# Patient Record
Sex: Female | Born: 1965 | Race: Black or African American | Hispanic: No | Marital: Married | Smoking: Never smoker
Health system: Southern US, Community
[De-identification: ages and names within clinical notes are randomized; demographics above are authoritative.]

## PROBLEM LIST (undated history)

## (undated) DIAGNOSIS — F329 Major depressive disorder, single episode, unspecified: Secondary | ICD-10-CM

## (undated) DIAGNOSIS — F32A Depression, unspecified: Secondary | ICD-10-CM

## (undated) DIAGNOSIS — G43909 Migraine, unspecified, not intractable, without status migrainosus: Secondary | ICD-10-CM

## (undated) DIAGNOSIS — L509 Urticaria, unspecified: Secondary | ICD-10-CM

## (undated) DIAGNOSIS — K219 Gastro-esophageal reflux disease without esophagitis: Secondary | ICD-10-CM

## (undated) HISTORY — PX: KNEE ARTHROSCOPY: SHX127

---

## 2011-06-15 ENCOUNTER — Ambulatory Visit: Payer: Self-pay

## 2012-01-05 ENCOUNTER — Ambulatory Visit: Payer: Self-pay | Admitting: Internal Medicine

## 2012-01-05 ENCOUNTER — Emergency Department: Payer: Self-pay | Admitting: Emergency Medicine

## 2012-01-05 LAB — COMPREHENSIVE METABOLIC PANEL
Albumin: 3.9 g/dL (ref 3.4–5.0)
Alkaline Phosphatase: 80 U/L (ref 50–136)
BUN: 13 mg/dL (ref 7–18)
Co2: 26 mmol/L (ref 21–32)
Creatinine: 0.86 mg/dL (ref 0.60–1.30)
EGFR (African American): >60
Glucose: 68 mg/dL (ref 65–99)
SGPT (ALT): 15 U/L (ref 12–78)
Sodium: 137 mmol/L (ref 136–145)
Total Protein: 8.1 g/dL (ref 6.4–8.2)

## 2012-01-05 LAB — CBC
HCT: 38.7 % (ref 35.0–47.0)
MCV: 77 fL — ABNORMAL LOW (ref 80–100)
Platelet: 416 10*3/uL (ref 150–440)
RBC: 5.04 10*6/uL (ref 3.80–5.20)
WBC: 9.7 10*3/uL (ref 3.6–11.0)

## 2012-01-05 LAB — TROPONIN I
Troponin-I: <0.02 ng/mL
Troponin-I: <0.02 ng/mL

## 2012-01-05 LAB — CK TOTAL AND CKMB (NOT AT ARMC): CK-MB: 4.3 ng/mL — ABNORMAL HIGH (ref 0.5–3.6)

## 2017-01-29 ENCOUNTER — Ambulatory Visit
Admission: EM | Admit: 2017-01-29 | Discharge: 2017-01-29 | Disposition: A | Discharge status: Home / Self Care | Payer: BC Managed Care – PPO | Attending: Family Medicine | Admitting: Family Medicine

## 2017-01-29 DIAGNOSIS — T887XXA Unspecified adverse effect of drug or medicament, initial encounter: Secondary | ICD-10-CM

## 2017-01-29 DIAGNOSIS — J029 Acute pharyngitis, unspecified: Secondary | ICD-10-CM | POA: Diagnosis not present

## 2017-01-29 DIAGNOSIS — T50905A Adverse effect of unspecified drugs, medicaments and biological substances, initial encounter: Secondary | ICD-10-CM

## 2017-01-29 HISTORY — DX: Gastro-esophageal reflux disease without esophagitis: K21.9

## 2017-01-29 HISTORY — DX: Depression, unspecified: F32.A

## 2017-01-29 HISTORY — DX: Major depressive disorder, single episode, unspecified: F32.9

## 2017-01-29 HISTORY — DX: Urticaria, unspecified: L50.9

## 2017-01-29 HISTORY — DX: Migraine, unspecified, not intractable, without status migrainosus: G43.909

## 2017-01-29 LAB — RAPID STREP SCREEN (MED CTR MEBANE ONLY): STREPTOCOCCUS, GROUP A SCREEN (DIRECT): NEGATIVE

## 2017-01-29 NOTE — Discharge Instructions (Signed)
Continue using Allegra, Zantac 150 mg twice daily, and Benadryl. Recommend following up with your primary care physician at [facility] , [place]. Go to The emergency room if symptoms worsen.

## 2017-01-29 NOTE — ED Notes (Signed)
AVS reviewed with pt and family. Verbalized understanding. Pt has no questions or concerns at this time. Pt ambulatory at time of discharge.

## 2017-01-29 NOTE — ED Triage Notes (Signed)
Pt reports she had a "crusty" right eye several days ago but has improved. Now 2 days of mouth soreness, tongue burning, sore throat and developing laryngitis. No cough or fever. Had knee surgery last week and went home with Oxycodone and Phenergan which she has stopped.

## 2017-01-29 NOTE — ED Provider Notes (Signed)
[facility]    CSN: [id number] Arrival date & time: 01/29/17  1232     History   Chief Complaint Chief Complaint  Patient presents with  . Sore Throat    HPI Sarah Warner is a 51 y.o. female.   HPI  Subjective 51 year old female who presents with mouth soreness tongue burning sore throat and hoarseness. Said no cough or fever. Patient states that a week ago she underwent a an arthroscopy for meniscectomy discharged home on oxycodone and Phenergan. She states that she stop the Phenergan oxycodone about 2 days ago after she developed the mouth soreness tongue burning and sore throat. She has a history of chronic I angioedema and urticaria. She routinely takes Allegra but had stopped the Allegra daily before her symptoms with the oxycodone and Phenergan. Since restarted it. She denies any fever or chills. She's had no shortness of breath or difficulty breathing. She has had no appearance of any hives on the skin.        Past Medical History:  Diagnosis Date  . Depression   . GERD (gastroesophageal reflux disease)   . Hives   . Migraines     There are no active problems to display for this patient.   Past Surgical History:  Procedure Laterality Date  . CESAREAN SECTION    . KNEE ARTHROSCOPY      OB History    No data available       Home Medications    Prior to Admission medications   Medication Sig Start Date End Date Taking? Authorizing Provider  fexofenadine (ALLEGRA) 180 MG tablet Take 180 mg by mouth daily.   Yes [provider]  hydroxychloroquine (PLAQUENIL) 200 MG tablet Take by mouth daily.   Yes [provider]  hydrOXYzine (VISTARIL) 25 MG capsule Take 25 mg by mouth 3 (three) times daily as needed.   Yes [provider]  mirtazapine (REMERON) 15 MG tablet Take 15 mg by mouth at bedtime.   Yes [provider]  OnabotulinumtoxinA (BOTOX IJ) Inject as directed.   Yes [provider]  pantoprazole  (PROTONIX) 20 MG tablet Take 20 mg by mouth daily.   Yes [provider]  ranitidine (ZANTAC) 150 MG tablet Take 150 mg by mouth 2 (two) times daily.   Yes [provider]  topiramate (TOPAMAX) 100 MG tablet Take 100 mg by mouth daily.   Yes [provider]  venlafaxine (EFFEXOR) 75 MG tablet Take 75 mg by mouth 3 (three) times daily with meals.   Yes [provider]    Family History History reviewed. No pertinent family history.  Social History Social History  Substance Use Topics  . Smoking status: Never Smoker  . Smokeless tobacco: Never Used  . Alcohol use Yes     Comment: social     Allergies   Nsaids and Erythromycin   Review of Systems Review of Systems  Constitutional: Positive for activity change. Negative for appetite change, chills, fatigue and fever.  HENT: Positive for sore throat and voice change.   Respiratory: Negative for cough, shortness of breath, wheezing and stridor.   All other systems reviewed and are negative.    Physical Exam Triage Vital Signs ED Triage Vitals [01/29/17 1305]  Enc Vitals Group     BP 128/65     Pulse Rate 94     Resp 20     Temp 98.7 F (37.1 C)     Temp Source Oral  SpO2 100 %     Weight 240 lb (108.9 kg)     Height 5\' 5"  (1.651 m)     Head Circumference      Peak Flow      Pain Score 2     Pain Loc      Pain Edu?      Excl. in GC?    No data found.   Updated Vital Signs BP 128/65 (BP Location: Left Arm)   Pulse 94   Temp 98.7 F (37.1 C) (Oral)   Resp 20   Ht 5\' 5"  (1.651 m)   Wt 240 lb (108.9 kg)   LMP 01/13/2017   SpO2 100%   BMI 39.94 kg/m   Visual Acuity Right Eye Distance:   Left Eye Distance:   Bilateral Distance:    Right Eye Near:   Left Eye Near:    Bilateral Near:     Physical Exam  Constitutional: She is oriented to person, place, and time. She appears well-developed and well-nourished. No distress.  HENT:  Head: Normocephalic.  Right Ear:  External ear normal.  Left Ear: External ear normal.  Nose: Nose normal.  Mouth/Throat: Oropharynx is clear and moist. No oropharyngeal exudate.  Patient does have a slight hoarseness  Eyes: Pupils are equal, round, and reactive to light. Conjunctivae are normal. Right eye exhibits no discharge. Left eye exhibits no discharge.  Neck: Normal range of motion. Neck supple.  Pulmonary/Chest: Effort normal and breath sounds normal.  Musculoskeletal: Normal range of motion.  Lymphadenopathy:    She has no cervical adenopathy.  Neurological: She is alert and oriented to person, place, and time.  Skin: Skin is warm and dry. No rash noted. She is not diaphoretic.  Psychiatric: She has a normal mood and affect. Her behavior is normal. Judgment and thought content normal.  Nursing note and vitals reviewed.    UC Treatments / Results  Labs (all labs ordered are listed, but only abnormal results are displayed) Labs Reviewed  RAPID STREP SCREEN (NOT AT [facility])  CULTURE, GROUP A STREP [facility])    EKG  EKG Interpretation None       Radiology No results found.  Procedures Procedures (including critical care time)  Medications Ordered in UC Medications - No data to display   Initial Impression / Assessment and Plan / UC Course  I have reviewed the triage vital signs and the nursing notes.  Pertinent labs & imaging results that were available during my care of the patient were reviewed by me and considered in my medical decision making (see chart for details).     Plan: 1. Test/x-ray results and diagnosis reviewed with patient 2. rx as per orders; risks, benefits, potential side effects reviewed with patient 3. Recommend supportive treatment with use of Allegra and Zantac along with Benadryl. Do not restart taking oxycodone or Phenergan. Cultures of the throat swab will be available in 48 hours. If any of those turn positive she will be treated accordingly. The voice change seems to be  more viral. It sounds as if she had a reaction to the Phenergan possibly. I've encouraged her to follow-up with her primary care physician at the [facility] [place], [place]. If she worsens she should go to the emergency room immediately. 4. F/u prn if symptoms worsen or don't improve   Final Clinical Impressions(s) / UC Diagnoses   Final diagnoses:  Drug reaction, initial encounter    New Prescriptions Discharge Medication List as of 01/29/2017  2:31 PM       Controlled Substance Prescriptions [place] Controlled Substance Registry consulted? Not Applicable   Lutricia Feil, PA-C 01/29/17 1442

## 2017-02-01 LAB — CULTURE, GROUP A STREP (THRC)

## 2019-02-08 ENCOUNTER — Emergency Department: Payer: BC Managed Care – PPO

## 2019-02-08 ENCOUNTER — Inpatient Hospital Stay
Admission: EM | Admit: 2019-02-08 | Discharge: 2019-02-13 | DRG: 871 | Disposition: A | Discharge status: Home / Self Care | Payer: BC Managed Care – PPO | Attending: Internal Medicine | Admitting: Internal Medicine

## 2019-02-08 DIAGNOSIS — A419 Sepsis, unspecified organism: Principal | ICD-10-CM | POA: Diagnosis present

## 2019-02-08 DIAGNOSIS — R059 Cough, unspecified: Secondary | ICD-10-CM

## 2019-02-08 DIAGNOSIS — R05 Cough: Secondary | ICD-10-CM

## 2019-02-08 DIAGNOSIS — J9601 Acute respiratory failure with hypoxia: Secondary | ICD-10-CM | POA: Diagnosis present

## 2019-02-08 DIAGNOSIS — F329 Major depressive disorder, single episode, unspecified: Secondary | ICD-10-CM | POA: Diagnosis present

## 2019-02-08 DIAGNOSIS — Z888 Allergy status to other drugs, medicaments and biological substances status: Secondary | ICD-10-CM

## 2019-02-08 DIAGNOSIS — E876 Hypokalemia: Secondary | ICD-10-CM | POA: Diagnosis present

## 2019-02-08 DIAGNOSIS — I10 Essential (primary) hypertension: Secondary | ICD-10-CM | POA: Diagnosis present

## 2019-02-08 DIAGNOSIS — K219 Gastro-esophageal reflux disease without esophagitis: Secondary | ICD-10-CM | POA: Diagnosis present

## 2019-02-08 DIAGNOSIS — Z881 Allergy status to other antibiotic agents status: Secondary | ICD-10-CM

## 2019-02-08 DIAGNOSIS — D509 Iron deficiency anemia, unspecified: Secondary | ICD-10-CM | POA: Diagnosis present

## 2019-02-08 DIAGNOSIS — J9811 Atelectasis: Secondary | ICD-10-CM | POA: Diagnosis present

## 2019-02-08 DIAGNOSIS — Z20828 Contact with and (suspected) exposure to other viral communicable diseases: Secondary | ICD-10-CM | POA: Diagnosis present

## 2019-02-08 DIAGNOSIS — Z79899 Other long term (current) drug therapy: Secondary | ICD-10-CM | POA: Diagnosis not present

## 2019-02-08 DIAGNOSIS — J849 Interstitial pulmonary disease, unspecified: Secondary | ICD-10-CM | POA: Diagnosis present

## 2019-02-08 DIAGNOSIS — Z7722 Contact with and (suspected) exposure to environmental tobacco smoke (acute) (chronic): Secondary | ICD-10-CM | POA: Diagnosis present

## 2019-02-08 LAB — PROTIME-INR
INR: 1.1 (ref 0.8–1.2)
Prothrombin Time: 13.8 seconds (ref 11.4–15.2)

## 2019-02-08 LAB — URINALYSIS, ROUTINE W REFLEX MICROSCOPIC
Bacteria, UA: NONE SEEN
Bilirubin Urine: NEGATIVE
Glucose, UA: NEGATIVE mg/dL
Ketones, ur: NEGATIVE mg/dL
Nitrite: NEGATIVE
Protein, ur: NEGATIVE mg/dL
Specific Gravity, Urine: 1.015 (ref 1.005–1.030)
pH: 5 (ref 5.0–8.0)

## 2019-02-08 LAB — CBC WITH DIFFERENTIAL/PLATELET
Abs Immature Granulocytes: 0.2 10*3/uL — ABNORMAL HIGH (ref 0.00–0.07)
Basophils Absolute: 0.1 10*3/uL (ref 0.0–0.1)
Basophils Relative: 0 %
Eosinophils Absolute: 0.2 10*3/uL (ref 0.0–0.5)
Eosinophils Relative: 1 %
HCT: 38.4 % (ref 36.0–46.0)
Hemoglobin: 12.6 g/dL (ref 12.0–15.0)
Immature Granulocytes: 1 %
Lymphocytes Relative: 8 %
Lymphs Abs: 1.9 10*3/uL (ref 0.7–4.0)
MCH: 25.7 pg — ABNORMAL LOW (ref 26.0–34.0)
MCHC: 32.8 g/dL (ref 30.0–36.0)
MCV: 78.2 fL — ABNORMAL LOW (ref 80.0–100.0)
Monocytes Absolute: 0.8 10*3/uL (ref 0.1–1.0)
Monocytes Relative: 4 %
Neutro Abs: 19.4 10*3/uL — ABNORMAL HIGH (ref 1.7–7.7)
Neutrophils Relative %: 86 %
Platelets: 509 10*3/uL — ABNORMAL HIGH (ref 150–400)
RBC: 4.91 MIL/uL (ref 3.87–5.11)
RDW: 14.9 % (ref 11.5–15.5)
WBC: 22.6 10*3/uL — ABNORMAL HIGH (ref 4.0–10.5)
nRBC: 0 % (ref 0.0–0.2)

## 2019-02-08 LAB — COMPREHENSIVE METABOLIC PANEL
ALT: 12 U/L (ref 0–44)
AST: 23 U/L (ref 15–41)
Albumin: 4.2 g/dL (ref 3.5–5.0)
Alkaline Phosphatase: 64 U/L (ref 38–126)
Anion gap: 11 (ref 5–15)
BUN: 15 mg/dL (ref 6–20)
CO2: 18 mmol/L — ABNORMAL LOW (ref 22–32)
Calcium: 9.6 mg/dL (ref 8.9–10.3)
Chloride: 107 mmol/L (ref 98–111)
Creatinine, Ser: 1.07 mg/dL — ABNORMAL HIGH (ref 0.44–1.00)
GFR calc Af Amer: >60 mL/min (ref >60)
GFR calc non Af Amer: 59 mL/min — ABNORMAL LOW (ref >60)
Glucose, Bld: 118 mg/dL — ABNORMAL HIGH (ref 70–99)
Potassium: 3.4 mmol/L — ABNORMAL LOW (ref 3.5–5.1)
Sodium: 136 mmol/L (ref 135–145)
Total Bilirubin: 0.7 mg/dL (ref 0.3–1.2)
Total Protein: 8.5 g/dL — ABNORMAL HIGH (ref 6.5–8.1)

## 2019-02-08 LAB — SARS CORONAVIRUS 2 BY RT PCR (HOSPITAL ORDER, PERFORMED IN ~~LOC~~ HOSPITAL LAB): SARS Coronavirus 2: NEGATIVE

## 2019-02-08 LAB — LACTIC ACID, PLASMA: Lactic Acid, Venous: 1.7 mmol/L (ref 0.5–1.9)

## 2019-02-08 LAB — PROCALCITONIN: Procalcitonin: 0.12 ng/mL

## 2019-02-08 MED ORDER — SODIUM CHLORIDE 0.9 % IV SOLN
2.0000 g | Freq: Once | INTRAVENOUS | Status: AC
  Administered 2019-02-08: 22:00:00 2 g via INTRAVENOUS
  Filled 2019-02-08: qty 2

## 2019-02-08 MED ORDER — METRONIDAZOLE IN NACL 5-0.79 MG/ML-% IV SOLN
500.0000 mg | Freq: Once | INTRAVENOUS | Status: AC
  Administered 2019-02-08: 500 mg via INTRAVENOUS
  Filled 2019-02-08: qty 100

## 2019-02-08 MED ORDER — VANCOMYCIN HCL 1.5 G IV SOLR
1500.0000 mg | INTRAVENOUS | Status: DC
  Administered 2019-02-10: 1500 mg via INTRAVENOUS
  Filled 2019-02-08 (×2): qty 1500

## 2019-02-08 MED ORDER — SODIUM CHLORIDE 0.9 % IV SOLN
2.0000 g | Freq: Three times a day (TID) | INTRAVENOUS | Status: DC
  Administered 2019-02-09 – 2019-02-10 (×4): 2 g via INTRAVENOUS
  Filled 2019-02-08 (×6): qty 2

## 2019-02-08 MED ORDER — ACETAMINOPHEN 325 MG PO TABS
ORAL_TABLET | ORAL | Status: AC
  Filled 2019-02-08: qty 2

## 2019-02-08 MED ORDER — METHYLPREDNISOLONE SODIUM SUCC 125 MG IJ SOLR
60.0000 mg | Freq: Four times a day (QID) | INTRAMUSCULAR | Status: DC
  Administered 2019-02-09 – 2019-02-10 (×6): 60 mg via INTRAVENOUS
  Filled 2019-02-08 (×6): qty 2

## 2019-02-08 MED ORDER — VANCOMYCIN HCL 1.5 G IV SOLR
1500.0000 mg | INTRAVENOUS | Status: DC
  Filled 2019-02-08: qty 1500

## 2019-02-08 MED ORDER — VANCOMYCIN HCL IN DEXTROSE 1-5 GM/200ML-% IV SOLN
1000.0000 mg | Freq: Once | INTRAVENOUS | Status: DC
  Filled 2019-02-08: qty 200

## 2019-02-08 MED ORDER — VANCOMYCIN HCL 10 G IV SOLR
2000.0000 mg | Freq: Once | INTRAVENOUS | Status: AC
  Administered 2019-02-08: 2000 mg via INTRAVENOUS
  Filled 2019-02-08: qty 2000

## 2019-02-08 MED ORDER — SODIUM CHLORIDE 0.9 % IV SOLN
1000.0000 mL | Freq: Once | INTRAVENOUS | Status: AC
  Administered 2019-02-08: 1000 mL via INTRAVENOUS

## 2019-02-08 MED ORDER — ACETAMINOPHEN 325 MG PO TABS
650.0000 mg | ORAL_TABLET | Freq: Once | ORAL | Status: AC
  Administered 2019-02-08: 650 mg via ORAL

## 2019-02-08 NOTE — Consult Note (Signed)
PHARMACY -  BRIEF ANTIBIOTIC NOTE   Pharmacy has received consult(s) for Cefepime and Vancomycin  from an ED provider.  The patient's profile has been reviewed for ht/wt/allergies/indication/available labs.    One time order(s) placed for Cefepime 2g IV and Vancomycin 2000mg    Spoke with RN @ 2030 about change in vancomycin dose and requested weight and height for patient be put into EPIC.   Further antibiotics/pharmacy consults should be ordered by admitting physician if indicated.                       Thank you, Pernell Dupre, PharmD, BCPS Clinical Pharmacist 02/08/2019 9:35 PM

## 2019-02-08 NOTE — ED Notes (Signed)
ED TO INPATIENT HANDOFF REPORT  ED Nurse Name and Phone #: Kelcie Currie [phone]  S Name/Age/Gender Sarah Warner 53 y.o. female Room/Bed: ED01A/ED01A  Code Status   Code Status: Not on file  Home/SNF/Other Home Patient oriented to: self, place, time and situation Is this baseline? Yes   Triage Complete: Triage complete  Chief Complaint SOB  Triage Note Pt with cough, shortness of breath. Pt speaking short clipped sentences, marked shob noted on exertion. 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    Allergies Allergies  Allergen Reactions  . Nsaids Swelling  . Erythromycin Rash    Level of Care/Admitting Diagnosis ED Disposition    ED Disposition Condition Comment   Admit  [facility]ospital [facility] [[id number]]  Level of Care: Telemetry [5]  Covid Evaluation: Confirmed COVID Negative  Diagnosis: Sepsis [facility](HCC) [[id number]]) [[id number]]  Admitting Physician: Delfino LovettSHAH, VIPUL [[id number]]  Attending Physician: Delfino LovettSHAH, VIPUL [[id number]]  Estimated length of stay: past midnight tomorrow  Certification:: I certify this patient will need inpatient services for at least 2 midnights  PT Class (Do Not Modify): Inpatient [101]  PT Acc Code (Do Not Modify): Private [1]       B Medical/Surgery History Past Medical History:  Diagnosis Date  . Depression   . GERD (gastroesophageal reflux disease)   . Hives   . Migraines    Past Surgical History:  Procedure Laterality Date  . CESAREAN SECTION    . KNEE ARTHROSCOPY       A IV Location/Drains/Wounds Patient Lines/Drains/Airways Status   Active Line/Drains/Airways    Name:   Placement date:   Placement time:   Site:   Days:   Peripheral IV 02/08/19 Left Antecubital   02/08/19    2040    Antecubital   less than 1   Peripheral IV 02/08/19 Left Hand   02/08/19    2040    Hand   less than 1          Intake/Output Last 24 hours  Intake/Output Summary (Last 24 hours) at 02/08/2019 2350 Last data filed at 02/08/2019  2312 Gross per 24 hour  Intake 1200 ml  Output -  Net 1200 ml    Labs/Imaging Results for orders placed or performed during the hospital encounter of 02/08/19 (from the past 48 hour(s))  Lactic acid, plasma     Status: None   Collection Time: 02/08/19  8:55 PM  Result Value Ref Range   Lactic Acid, Venous 1.7 0.5 - 1.9 mmol/L    Comment: Performed at [facility], [address] [place], [place] [address]  Comprehensive metabolic panel     Status: Abnormal   Collection Time: 02/08/19  8:55 PM  Result Value Ref Range   Sodium 136 135 - 145 mmol/L   Potassium 3.4 (L) 3.5 - 5.1 mmol/L   Chloride 107 98 - 111 mmol/L   CO2 18 (L) 22 - 32 mmol/L   Glucose, Bld 118 (H) 70 - 99 mg/dL   BUN 15 6 - 20 mg/dL   Creatinine, Ser 2.131.07 (H) 0.44 - 1.00 mg/dL   Calcium 9.6 8.9 - 08.610.3 mg/dL   Total Protein 8.5 (H) 6.5 - 8.1 g/dL   Albumin 4.2 3.5 - 5.0 g/dL   AST 23 15 - 41 U/L   ALT 12 0 - 44 U/L   Alkaline Phosphatase 64 38 - 126 U/L   Total Bilirubin 0.7 0.3 - 1.2 mg/dL   GFR calc non Af Amer 59 (  L) >60 mL/min   GFR calc Af Amer >60 >60 mL/min   Anion gap 11 5 - 15    Comment: Performed at [facility], [address] [place], [place] [address]  CBC WITH DIFFERENTIAL     Status: Abnormal   Collection Time: 02/08/19  8:55 PM  Result Value Ref Range   WBC 22.6 (H) 4.0 - 10.5 K/uL   RBC 4.91 3.87 - 5.11 MIL/uL   Hemoglobin 12.6 12.0 - 15.0 g/dL   HCT 46.2 70.3 - 50.0 %   MCV 78.2 (L) 80.0 - 100.0 fL   MCH 25.7 (L) 26.0 - 34.0 pg   MCHC 32.8 30.0 - 36.0 g/dL   RDW 93.8 18.2 - 99.3 %   Platelets 509 (H) 150 - 400 K/uL   nRBC 0.0 0.0 - 0.2 %   Neutrophils Relative % 86 %   Neutro Abs 19.4 (H) 1.7 - 7.7 K/uL   Lymphocytes Relative 8 %   Lymphs Abs 1.9 0.7 - 4.0 K/uL   Monocytes Relative 4 %   Monocytes Absolute 0.8 0.1 - 1.0 K/uL   Eosinophils Relative 1 %   Eosinophils Absolute 0.2 0.0 - 0.5 K/uL   Basophils Relative 0 %   Basophils Absolute 0.1 0.0 - 0.1 K/uL    Immature Granulocytes 1 %   Abs Immature Granulocytes 0.20 (H) 0.00 - 0.07 K/uL    Comment: Performed at [facility], [address] [place], [place] [address]  Protime-INR     Status: None   Collection Time: 02/08/19  8:55 PM  Result Value Ref Range   Prothrombin Time 13.8 11.4 - 15.2 seconds   INR 1.1 0.8 - 1.2    Comment: (NOTE) INR goal varies based on device and disease states. Performed at [facility], [address] [place], [place] [address]   Procalcitonin     Status: None   Collection Time: 02/08/19  8:55 PM  Result Value Ref Range   Procalcitonin 0.12 ng/mL    Comment:        Interpretation: PCT (Procalcitonin) <= 0.5 ng/mL: Systemic infection (sepsis) is not likely. Local bacterial infection is possible. (NOTE)       Sepsis PCT Algorithm           Lower Respiratory Tract                                      Infection PCT Algorithm    ----------------------------     ----------------------------         PCT < 0.25 ng/mL                PCT < 0.10 ng/mL         Strongly encourage             Strongly discourage   discontinuation of antibiotics    initiation of antibiotics    ----------------------------     -----------------------------       PCT 0.25 - 0.50 ng/mL            PCT 0.10 - 0.25 ng/mL               OR       >80% decrease in PCT            Discourage initiation of  antibiotics      Encourage discontinuation           of antibiotics    ----------------------------     -----------------------------         PCT >= 0.50 ng/mL              PCT 0.26 - 0.50 ng/mL               AND        <80% decrease in PCT             Encourage initiation of                                             antibiotics       Encourage continuation           of antibiotics    ----------------------------     -----------------------------        PCT >= 0.50 ng/mL                  PCT > 0.50 ng/mL                AND         increase in PCT                  Strongly encourage                                      initiation of antibiotics    Strongly encourage escalation           of antibiotics                                     -----------------------------                                           PCT <= 0.25 ng/mL                                                 OR                                        > 80% decrease in PCT                                     Discontinue / Do not initiate                                             antibiotics Performed at [facility], [address] [place], [place] [address]   SARS Coronavirus 2 [facility] order, Performed in [facility] hospital lab) Nasopharyngeal Nasopharyngeal Swab  Status: None   Collection Time: 02/08/19  8:55 PM   Specimen: Nasopharyngeal Swab  Result Value Ref Range   SARS Coronavirus 2 NEGATIVE NEGATIVE    Comment: (NOTE) If result is NEGATIVE SARS-CoV-2 target nucleic acids are NOT DETECTED. The SARS-CoV-2 RNA is generally detectable in upper and lower  respiratory specimens during the acute phase of infection. The lowest  concentration of SARS-CoV-2 viral copies this assay can detect is 250  copies / mL. A negative result does not preclude SARS-CoV-2 infection  and should not be used as the sole basis for treatment or other  patient management decisions.  A negative result may occur with  improper specimen collection / handling, submission of specimen other  than nasopharyngeal swab, presence of viral mutation(s) within the  areas targeted by this assay, and inadequate number of viral copies  (<250 copies / mL). A negative result must be combined with clinical  observations, patient history, and epidemiological information. If result is POSITIVE SARS-CoV-2 target nucleic acids are DETECTED. The SARS-CoV-2 RNA is generally detectable in upper and lower  respiratory specimens dur ing the acute phase of  infection.  Positive  results are indicative of active infection with SARS-CoV-2.  Clinical  correlation with patient history and other diagnostic information is  necessary to determine patient infection status.  Positive results do  not rule out bacterial infection or co-infection with other viruses. If result is PRESUMPTIVE POSTIVE SARS-CoV-2 nucleic acids MAY BE PRESENT.   A presumptive positive result was obtained on the submitted specimen  and confirmed on repeat testing.  While 2019 novel coronavirus  (SARS-CoV-2) nucleic acids may be present in the submitted sample  additional confirmatory testing may be necessary for epidemiological  and / or clinical management purposes  to differentiate between  SARS-CoV-2 and other Sarbecovirus currently known to infect humans.  If clinically indicated additional testing with an alternate test  methodology [phone] is advised. The SARS-CoV-2 RNA is generally  detectable in upper and lower respiratory sp ecimens during the acute  phase of infection. The expected result is Negative. Fact Sheet for Patients:  BoilerBrush.com.cyhttps://www.fda.gov/media/136312/download Fact Sheet for Healthcare Providers: https://pope.com/https://www.fda.gov/media/136313/download This test is not yet approved or cleared by the [place] FDA and has been authorized for detection and/or diagnosis of SARS-CoV-2 by FDA under an Emergency Use Authorization (EUA).  This EUA will remain in effect (meaning this test can be used) for the duration of the COVID-19 declaration under Section 564(b)(1) of the Act, 21 U.S.C. section 360bbb-3(b)(1), unless the authorization is terminated or revoked sooner. Performed at [facility], [address] [place], [place] [address]    Dg Chest Port 1 View  Result Date: 02/08/2019 CLINICAL DATA:  Shortness of breath EXAM: PORTABLE CHEST 1 VIEW COMPARISON:  01/05/2012 FINDINGS: Mildly low lung volumes. Streaky opacity at the bases. Normal heart size.  No pneumothorax. IMPRESSION: Low lung volumes with streaky basilar opacities, favored to represent subsegmental atelectasis Electronically Signed   By: Jasmine PangKim  Fujinaga M.D.   On: 02/08/2019 20:52    Pending Labs Unresulted Labs (From admission, onward)    Start     Ordered   02/08/19 2035  Lactic acid, plasma  Now then every 2 hours,   STAT     02/08/19 2035   02/08/19 2035  Blood Culture (routine x 2)  BLOOD CULTURE X 2,   STAT     02/08/19 2035   02/08/19 2035  Urinalysis, Routine w reflex microscopic  ONCE - STAT,  STAT     02/08/19 2035   02/08/19 2035  Urine culture  ONCE - STAT,   STAT     02/08/19 2035   Signed and Held  HIV antibody (Routine Testing)  Once,   R     Signed and Held   Signed and Held  Protime-INR  Tomorrow morning,   R     Signed and Held   Signed and [employer], blood  Tomorrow morning,   R     Signed and Held   Signed and Held  Procalcitonin  Tomorrow morning,   R     Signed and Held   Signed and Held  CBC  (enoxaparin (LOVENOX)    CrCl >/= 30 ml/min)  Once,   R    Comments: Baseline for enoxaparin therapy IF NOT ALREADY DRAWN.  Notify MD if PLT < 100 K.    Signed and Held   Signed and Held  Creatinine, serum  (enoxaparin (LOVENOX)    CrCl >/= 30 ml/min)  Once,   R    Comments: Baseline for enoxaparin therapy IF NOT ALREADY DRAWN.    Signed and Held   Signed and Held  Creatinine, serum  (enoxaparin (LOVENOX)    CrCl >/= 30 ml/min)  Weekly,   R    Comments: while on enoxaparin therapy    Signed and Held   Signed and Held  Culture, sputum-assessment  Once,   R     Signed and Held   Signed and Held  Lactic acid, plasma  STAT Now then every 3 hours,   STAT     Signed and Held   Signed and Held  Basic metabolic panel  Tomorrow morning,   R     Signed and Held   Signed and Held  CBC  Tomorrow morning,   R     Signed and Held          Vitals/Pain Today's Vitals   02/08/19 2208 02/08/19 2230 02/08/19 2300 02/08/19 2330  BP: 137/67 116/64 124/66  (!) 112/59  Pulse: (!) 104 (!) 102 99 98  Resp: (!) 28     Temp: 99.2 F (37.3 C)     TempSrc: Oral     SpO2: 98% 99% 98% 99%  Weight:      Height:        Isolation Precautions No active isolations  Medications Medications  vancomycin (VANCOCIN) 2,000 mg in sodium chloride 0.9 % 500 mL IVPB (2,000 mg Intravenous New Bag/Given 02/08/19 2345)  methylPREDNISolone sodium succinate (SOLU-MEDROL) 125 mg/2 mL injection 60 mg (has no administration in time range)  0.9 %  sodium chloride infusion (0 mLs Intravenous Stopped 02/08/19 2233)  acetaminophen (TYLENOL) tablet 650 mg (650 mg Oral Given 02/08/19 2110)  ceFEPIme (MAXIPIME) 2 g in sodium chloride 0.9 % 100 mL IVPB (0 g Intravenous Stopped 02/08/19 2201)  metroNIDAZOLE (FLAGYL) IVPB 500 mg (0 mg Intravenous Stopped 02/08/19 2312)    Mobility walks Moderate fall risk   Focused Assessments Cardiac Assessment Handoff:  Cardiac Rhythm: Sinus tachycardia Lab Results  Component Value Date   CKTOTAL 78 01/05/2012   CKMB 4.3 (H) 01/05/2012   TROPONINI < 0.02 01/05/2012   No results found for: DDIMER Does the Patient currently have chest pain? No     R Recommendations: See Admitting Provider Note  Report given to:   Additional Notes:

## 2019-02-08 NOTE — Progress Notes (Signed)
CODE SEPSIS - PHARMACY COMMUNICATION  **Broad Spectrum Antibiotics should be administered within 1 hour of Sepsis diagnosis**  Time Code Sepsis Called/Page Received: @ 2036  Antibiotics Ordered: Vancomycin, Cefepime, Flagyl   Time of 1st antibiotic administration: @ 2131  Additional action taken by pharmacy: Messaged MD @ 2055 about Code Sepsis patient not having Abx orders.   If necessary, Name of Provider/Nurse Contacted: Dr. Theodoro Grist, PharmD, BCPS Clinical Pharmacist 02/08/2019 9:34 PM

## 2019-02-08 NOTE — ED Notes (Signed)
Dr. Corky Downs at bedside. Patient placed on 02 at 2L by Dr. Corky Downs for work of breathing.

## 2019-02-08 NOTE — ED Triage Notes (Signed)
Pt with cough, shortness of breath. Pt speaking short clipped sentences, marked shob noted on exertion. 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

## 2019-02-08 NOTE — ED Provider Notes (Signed)
[facility] Emergency Department Provider Note   ____________________________________________    I have reviewed the triage vital signs and the nursing notes.   HISTORY  Chief Complaint Shortness of Breath     HPI Sarah Warner is a 53 y.o. female who presents with complaints of shortness of breath and cough.  Patient reports that she has had a cough for nearly a week, has [facility], has had a CT scan followed by an x-ray.  CT scan negative for PE, x-ray concerning for possible pneumonia.  She is on antibiotics.  Negative coronavirus test 2 days ago.  Positive fever, chills, shortness of breath became much worse today.  No calf pain or swelling  Past Medical History:  Diagnosis Date  . Depression   . GERD (gastroesophageal reflux disease)   . Hives   . Migraines     There are no active problems to display for this patient.   Past Surgical History:  Procedure Laterality Date  . CESAREAN SECTION    . KNEE ARTHROSCOPY      Prior to Admission medications   Medication Sig Start Date End Date Taking? Authorizing Provider  albuterol (VENTOLIN HFA) 108 (90 Base) MCG/ACT inhaler Inhale 1-2 puffs into the lungs every 4 (four) hours as needed. 02/03/19  Yes [provider]  doxycycline (VIBRA-TABS) 100 MG tablet Take 100 mg by mouth 2 (two) times daily. 02/03/19  Yes [provider]  famotidine (PEPCID) 20 MG tablet Take 20 mg by mouth 2 (two) times daily.   Yes [provider]  fexofenadine (ALLEGRA) 60 MG tablet Take 60 mg by mouth 2 (two) times daily.    Yes [provider]  hydroxychloroquine (PLAQUENIL) 200 MG tablet Take 400 mg by mouth at bedtime.    Yes [provider]  mirtazapine (REMERON) 15 MG tablet Take 15 mg by mouth at bedtime.   Yes [provider]  hydrOXYzine (VISTARIL) 25 MG capsule Take 25 mg by mouth 3 (three) times daily as needed.    [provider]   olmesartan (BENICAR) 40 MG tablet Take 40 mg by mouth daily. 12/24/18   [provider]  OnabotulinumtoxinA (BOTOX IJ) Inject as directed.    [provider]  pantoprazole (PROTONIX) 20 MG tablet Take 20 mg by mouth daily.    [provider]  ranitidine (ZANTAC) 150 MG tablet Take 150 mg by mouth 2 (two) times daily.    [provider]  topiramate (TOPAMAX) 100 MG tablet Take 100 mg by mouth daily.    [provider]  venlafaxine (EFFEXOR) 75 MG tablet Take 75 mg by mouth 3 (three) times daily with meals.    [provider]     Allergies Nsaids and Erythromycin  No family history on file.  Social History Social History   Tobacco Use  . Smoking status: Never Smoker  . Smokeless tobacco: Never Used  Substance Use Topics  . Alcohol use: Yes    Comment: social  . Drug use: No    Review of Systems  Constitutional: As above Eyes: No visual changes.  ENT: No sore throat. Cardiovascular: As above Respiratory: As above Gastrointestinal: No abdominal pain.  No nausea, no vomiting.   Genitourinary: Negative for dysuria. Musculoskeletal: Achy Skin: Negative for rash. Neurological: Negative for headaches or weakness   ____________________________________________   PHYSICAL EXAM:  VITAL SIGNS: ED Triage Vitals [02/08/19 2024]  Enc Vitals Group  BP 120/84     Pulse Rate (!) 148     Resp (!) 32     Temp (!) 102.6 F (39.2 C)     Temp src      SpO2 96 %     Weight      Height      Head Circumference      Peak Flow      Pain Score      Pain Loc      Pain Edu?      Excl. in [place]?     Constitutional: Alert and oriented.  Ill-appearing Eyes: Conjunctivae are normal.  Head: Atraumatic. Nose: No congestion/rhinnorhea. Mouth/Throat: Mucous membranes are moist.   Neck:  Painless ROM Cardiovascular: Tachycardia, regular rhythm. Grossly normal heart sounds.  Good peripheral circulation. Respiratory: Increased  respiratory effort with tachypnea, no retractions, bibasilar Rales, no wheezes Gastrointestinal: Soft and nontender. No distention.  No CVA tenderness.  Musculoskeletal: No lower extremity tenderness nor edema.  Warm and well perfused Neurologic:  Normal speech and language. No gross focal neurologic deficits are appreciated.  Skin:  Skin is warm, dry and intact. No rash noted. Psychiatric: Mood and affect are normal. Speech and behavior are normal.  ____________________________________________   LABS (all labs ordered are listed, but only abnormal results are displayed)  Labs Reviewed  COMPREHENSIVE METABOLIC PANEL - Abnormal; Notable for the following components:      Result Value   Potassium 3.4 (*)    CO2 18 (*)    Glucose, Bld 118 (*)    Creatinine, Ser 1.07 (*)    Total Protein 8.5 (*)    GFR calc non Af Amer 59 (*)    All other components within normal limits  CBC WITH DIFFERENTIAL/PLATELET - Abnormal; Notable for the following components:   WBC 22.6 (*)    MCV 78.2 (*)    MCH 25.7 (*)    Platelets 509 (*)    Neutro Abs 19.4 (*)    Abs Immature Granulocytes 0.20 (*)    All other components within normal limits  SARS CORONAVIRUS 2 (HOSPITAL ORDER, [facility])  CULTURE, BLOOD (ROUTINE X 2)  CULTURE, BLOOD (ROUTINE X 2)  URINE CULTURE  LACTIC ACID, PLASMA  PROTIME-INR  PROCALCITONIN  LACTIC ACID, PLASMA  URINALYSIS, ROUTINE W REFLEX MICROSCOPIC   ____________________________________________  EKG  ED ECG REPORT I, Lavonia Drafts, the attending physician, personally viewed and interpreted this ECG.  Date: 02/08/2019  Rhythm: Sinus tachycardia QRS Axis: normal Intervals: normal ST/T Wave abnormalities: normal Narrative Interpretation: no evidence of acute ischemia  ____________________________________________  RADIOLOGY  Chest x-ray unremarkable ____________________________________________   PROCEDURES  Procedure(s)  performed: No  Procedures   Critical Care performed: No ____________________________________________   INITIAL IMPRESSION / ASSESSMENT AND PLAN / ED COURSE  Pertinent labs & imaging results that were available during my care of the patient were reviewed by me and considered in my medical decision making (see chart for details).  Patient presents with significant shortness of breath, heart rate of 140s, oxygen saturations in the low 90s, strong concern for bacterial pneumonia versus coronavirus  Lab work significant for significantly elevated white blood cell count of 22,000, will cover with IV antibiotics for presumed sepsis.  Pending coronavirus swab, pro calcitonin  Chest x-ray is overall reassuring which is unexpected.  Procalcitonin is 0.12, pending urinalysis.  Patient will require admission for further work-up to determine the etiology of her fever    ____________________________________________  FINAL CLINICAL IMPRESSION(S) / ED DIAGNOSES  Final diagnoses:  Sepsis, due to unspecified organism, unspecified whether acute organ dysfunction present [facility])        Note:  This document was prepared using Dragon voice recognition software and may include unintentional dictation errors.   Jene Every, MD 02/08/19 2233

## 2019-02-08 NOTE — ED Notes (Signed)
Patient assisted to the bathroom 

## 2019-02-08 NOTE — Consult Note (Signed)
Pharmacy Antibiotic Note  Sarah Warner is a 53 y.o. female admitted on 02/08/2019 with sepsis.  Pharmacy has been consulted for Cefepime and Vancomycin dosing.  Plan: Vancomycin 2000mg  IV x 1 dose given in ED. Start Vancomycin 1500 mg IV Q 24 hrs. Goal AUC 400-550. Expected AUC: 529.9 SCr used: 1.07  Start cefepime 2g IV every 8 hours    Height: 5' 5.5" (166.4 cm) Weight: 246 lb (111.6 kg) IBW/kg (Calculated) : 58.15  Temp (24hrs), Avg:100.9 F (38.3 C), Min:99.2 F (37.3 C), Max:102.6 F (39.2 C)  Recent Labs  Lab 02/08/19 2055  WBC 22.6*  CREATININE 1.07*  LATICACIDVEN 1.7    Estimated Creatinine Clearance: 76.4 mL/min (A) (by C-G formula based on SCr of 1.07 mg/dL (H)).    Allergies  Allergen Reactions  . Nsaids Swelling  . Erythromycin Rash    Antimicrobials this admission: 9/13 Flagyl >>  9/13 Vancomycin  >>  9/13 Cefepime>>  Microbiology results: 9/13 BCx: pending 9/13 UCx: pending 9/13 SARS Coronavirus 2: negative   Thank you for allowing pharmacy to be a part of this patient's care.  Pernell Dupre, PharmD, BCPS Clinical Pharmacist 02/08/2019 11:54 PM

## 2019-02-08 NOTE — H&P (Addendum)
[facility] [place] [facility]   PATIENT NAME: Sarah Warner    MR#:  [id number]  DATE OF BIRTH:  1965-11-17  DATE OF ADMISSION:  02/08/2019  PRIMARY CARE PHYSICIAN: Marlan Palau   REQUESTING/REFERRING PHYSICIAN: Jene Every, MD  CHIEF COMPLAINT:   Chief Complaint  Patient presents with  . Shortness of Breath    HISTORY OF PRESENT ILLNESS:  Sarah Warner  is a 53 y.o. female with a known history of depression, migraine is being admitted for sepsis likely due to viral exanthem. She reports shortness of breath and cough.  Patient reports that she has had a cough for nearly a week, has been seen at [facility] and urgent care, has had a CT scan followed by an x-ray.  CT scan negative for PE, x-ray concerning for possible pneumonia.  She was started on doxycycline without much improvement.  Negative coronavirus test 2 days ago.  Positive fever, chills, shortness of breath and it became much worse today.  While in the ED she is tachypneic, tachycardic, hypoxic and has leukocytosis.  No obvious source of infection seen. PAST MEDICAL HISTORY:   Past Medical History:  Diagnosis Date  . Depression   . GERD (gastroesophageal reflux disease)   . Hives   . Migraines     PAST SURGICAL HISTORY:   Past Surgical History:  Procedure Laterality Date  . CESAREAN SECTION    . KNEE ARTHROSCOPY      SOCIAL HISTORY:   Social History   Tobacco Use  . Smoking status: Never Smoker  . Smokeless tobacco: Never Used  Substance Use Topics  . Alcohol use: Yes    Comment: social    FAMILY HISTORY:  No family history on file.  DRUG ALLERGIES:   Allergies  Allergen Reactions  . Nsaids Swelling  . Erythromycin Rash    REVIEW OF SYSTEMS:   Review of Systems  Constitutional: Positive for chills, fever and malaise/fatigue. Negative for diaphoresis and weight loss.  HENT: Negative for ear discharge, ear pain, hearing loss, nosebleeds, sore throat and tinnitus.   Eyes:  Negative for blurred vision and pain.  Respiratory: Positive for shortness of breath. Negative for cough, hemoptysis and wheezing.   Cardiovascular: Negative for chest pain, palpitations, orthopnea and leg swelling.  Gastrointestinal: Negative for abdominal pain, blood in stool, constipation, diarrhea, heartburn, nausea and vomiting.  Genitourinary: Negative for dysuria, frequency and urgency.  Musculoskeletal: Negative for back pain and myalgias.  Skin: Negative for itching and rash.  Neurological: Negative for dizziness, tingling, tremors, focal weakness, seizures, weakness and headaches.  Psychiatric/Behavioral: Negative for depression. The patient is not nervous/anxious.    MEDICATIONS AT HOME:   Prior to Admission medications   Medication Sig Start Date End Date Taking? Authorizing Provider  albuterol (VENTOLIN HFA) 108 (90 Base) MCG/ACT inhaler Inhale 1-2 puffs into the lungs every 4 (four) hours as needed. 02/03/19  Yes [provider]  doxycycline (VIBRA-TABS) 100 MG tablet Take 100 mg by mouth 2 (two) times daily. 02/03/19  Yes [provider]  famotidine (PEPCID) 20 MG tablet Take 20 mg by mouth 2 (two) times daily.   Yes [provider]  fexofenadine (ALLEGRA) 60 MG tablet Take 60 mg by mouth 2 (two) times daily.    Yes [provider]  hydroxychloroquine (PLAQUENIL) 200 MG tablet Take 400 mg by mouth at bedtime.    Yes [provider]  mirtazapine (REMERON) 15 MG tablet Take 15 mg by mouth at bedtime.  Yes [provider]  olmesartan (BENICAR) 40 MG tablet Take 40 mg by mouth daily. 12/24/18  Yes [provider]  OnabotulinumtoxinA (BOTOX IJ) Inject as directed.   Yes [provider]  pantoprazole (PROTONIX) 20 MG tablet Take 20 mg by mouth daily.   Yes [provider]  sertraline (ZOLOFT) 50 MG tablet Take 100 mg by mouth daily. 01/22/19 02/21/19 Yes [provider]  hydrOXYzine (VISTARIL) 25 MG  capsule Take 25 mg by mouth 3 (three) times daily as needed.    [provider]  ranitidine (ZANTAC) 150 MG tablet Take 150 mg by mouth 2 (two) times daily.    [provider]  topiramate (TOPAMAX) 100 MG tablet Take 100 mg by mouth daily.    [provider]  venlafaxine (EFFEXOR) 75 MG tablet Take 75 mg by mouth 3 (three) times daily with meals.    [provider]      VITAL SIGNS:  Blood pressure 116/64, pulse (!) 102, temperature 99.2 F (37.3 C), temperature source Oral, resp. rate (!) 28, height 5' 5.5" (1.664 m), weight 111.6 kg, SpO2 99 %.  PHYSICAL EXAMINATION:  Physical Exam  GENERAL:  53 y.o.-year-old patient lying in the bed with no acute distress.  EYES: Pupils equal, round, reactive to light and accommodation. No scleral icterus. Extraocular muscles intact.  HEENT: Head atraumatic, normocephalic. Oropharynx and nasopharynx clear.  NECK:  Supple, no jugular venous distention. No thyroid enlargement, no tenderness.  LUNGS: Normal breath sounds bilaterally, wheezing, no rales,rhonchi or crepitation. No use of accessory muscles of respiration.  CARDIOVASCULAR: S1, S2 normal. No murmurs, rubs, or gallops.  ABDOMEN: Soft, nontender, nondistended. Bowel sounds present. No organomegaly or mass.  EXTREMITIES: No pedal edema, cyanosis, or clubbing.  NEUROLOGIC: Cranial nerves II through XII are intact. Muscle strength 5/5 in all extremities. Sensation intact. Gait not checked.  PSYCHIATRIC: The patient is alert and oriented x 3.  SKIN: No obvious rash, lesion, or ulcer.   LABORATORY PANEL:   CBC Recent Labs  Lab 02/08/19 2055  WBC 22.6*  HGB 12.6  HCT 38.4  PLT 509*   ------------------------------------------------------------------------------------------------------------------  Chemistries  Recent Labs  Lab 02/08/19 2055  NA 136  K 3.4*  CL 107  CO2 18*  GLUCOSE 118*  BUN 15  CREATININE 1.07*  CALCIUM 9.6  AST 23  ALT 12   ALKPHOS 64  BILITOT 0.7   ------------------------------------------------------------------------------------------------------------------  Cardiac Enzymes No results for input(s): TROPONINI in the last 168 hours. ------------------------------------------------------------------------------------------------------------------  RADIOLOGY:  Dg Chest Port 1 View  Result Date: 02/08/2019 CLINICAL DATA:  Shortness of breath EXAM: PORTABLE CHEST 1 VIEW COMPARISON:  01/05/2012 FINDINGS: Mildly low lung volumes. Streaky opacity at the bases. Normal heart size. No pneumothorax. IMPRESSION: Low lung volumes with streaky basilar opacities, favored to represent subsegmental atelectasis Electronically Signed   By: Jasmine PangKim  Fujinaga M.D.   On: 02/08/2019 20:52   IMPRESSION AND PLAN:  53 year old female being admitted for sepsis  *Sepsis -present on admission with fever, tachycardia, tachypnea, leukocytosis -Likely viral etiology -Her COVID test had been negative Chest x-ray not showing any obvious pneumonia -We will cover her with empiric antibiotics for now -Check respiratory panel and influenza panel - IV solu-medrol to help with broncho-spasm and related coughing  *Hypokalemia -Replete and recheck  *Depression -Continue Zoloft  *Thrombocytosis  -monitor, if persistent, consider outpatient hematology referral  *Respiratory symptoms -Cough, shortness of breath, wheezing -We will start IV steroids and nebulizer treatment  All the records are reviewed and case discussed with ED provider. Management plans discussed with the patient, family (husband at bedside) and they are in agreement.  CODE STATUS: Full code  TOTAL TIME TAKING CARE OF THIS PATIENT: 45 minutes.    Max Sane M.D on 02/08/2019 at 10:42 PM  Between 7am to 6pm - Pager - [phone]  After 6pm go to [email or web address] - password [email or web address] [facility]  [facility] [place] [facility]  Office  [phone]  CC:  Primary care physician; Dartha Lodge   Note: This dictation was prepared with Dragon dictation along with smaller phrase technology. Any transcriptional errors that result from this process are unintentional.

## 2019-02-09 ENCOUNTER — Inpatient Hospital Stay: Payer: BC Managed Care – PPO

## 2019-02-09 ENCOUNTER — Inpatient Hospital Stay
Admit: 2019-02-09 | Discharge: 2019-02-09 | Disposition: A | Discharge status: Home / Self Care | Payer: BC Managed Care – PPO | Attending: Internal Medicine | Admitting: Internal Medicine

## 2019-02-09 ENCOUNTER — Other Ambulatory Visit: Payer: Self-pay

## 2019-02-09 LAB — RESPIRATORY PANEL BY PCR

## 2019-02-09 LAB — BASIC METABOLIC PANEL
Anion gap: 8 (ref 5–15)
BUN: 13 mg/dL (ref 6–20)
CO2: 19 mmol/L — ABNORMAL LOW (ref 22–32)
Calcium: 8.9 mg/dL (ref 8.9–10.3)
Chloride: 110 mmol/L (ref 98–111)
Creatinine, Ser: 1 mg/dL (ref 0.44–1.00)
GFR calc Af Amer: >60 mL/min (ref >60)
GFR calc non Af Amer: >60 mL/min (ref >60)
Glucose, Bld: 148 mg/dL — ABNORMAL HIGH (ref 70–99)
Potassium: 3.9 mmol/L (ref 3.5–5.1)
Sodium: 137 mmol/L (ref 135–145)

## 2019-02-09 LAB — CBC
HCT: 35.5 % — ABNORMAL LOW (ref 36.0–46.0)
Hemoglobin: 11.6 g/dL — ABNORMAL LOW (ref 12.0–15.0)
MCH: 25.7 pg — ABNORMAL LOW (ref 26.0–34.0)
MCHC: 32.7 g/dL (ref 30.0–36.0)
MCV: 78.7 fL — ABNORMAL LOW (ref 80.0–100.0)
Platelets: 460 10*3/uL — ABNORMAL HIGH (ref 150–400)
RBC: 4.51 MIL/uL (ref 3.87–5.11)
RDW: 15 % (ref 11.5–15.5)
WBC: 24 10*3/uL — ABNORMAL HIGH (ref 4.0–10.5)
nRBC: 0 % (ref 0.0–0.2)

## 2019-02-09 LAB — PROCALCITONIN: Procalcitonin: 0.16 ng/mL

## 2019-02-09 LAB — PROTIME-INR
INR: 1.1 (ref 0.8–1.2)
Prothrombin Time: 13.7 seconds (ref 11.4–15.2)

## 2019-02-09 LAB — BRAIN NATRIURETIC PEPTIDE: B Natriuretic Peptide: 35 pg/mL (ref 0.0–100.0)

## 2019-02-09 LAB — TROPONIN I (HIGH SENSITIVITY): Troponin I (High Sensitivity): 10 ng/L (ref <18)

## 2019-02-09 LAB — LACTIC ACID, PLASMA
Lactic Acid, Venous: 1.2 mmol/L (ref 0.5–1.9)
Lactic Acid, Venous: 1 mmol/L (ref 0.5–1.9)

## 2019-02-09 LAB — CORTISOL-AM, BLOOD: Cortisol - AM: 6.4 ug/dL — ABNORMAL LOW (ref 6.7–22.6)

## 2019-02-09 MED ORDER — SERTRALINE HCL 50 MG PO TABS
100.0000 mg | ORAL_TABLET | Freq: Every day | ORAL | Status: DC
  Administered 2019-02-09 – 2019-02-13 (×5): 100 mg via ORAL
  Filled 2019-02-09 (×5): qty 2

## 2019-02-09 MED ORDER — SODIUM CHLORIDE 0.9 % IV BOLUS (SEPSIS): 500.0000 mL | Freq: Once | INTRAVENOUS | Status: DC

## 2019-02-09 MED ORDER — VANCOMYCIN HCL IN DEXTROSE 1-5 GM/200ML-% IV SOLN: 1000.0000 mg | Freq: Once | INTRAVENOUS | Status: DC

## 2019-02-09 MED ORDER — TOPIRAMATE 100 MG PO TABS
100.0000 mg | ORAL_TABLET | Freq: Every day | ORAL | Status: DC
  Administered 2019-02-09 – 2019-02-12 (×4): 100 mg via ORAL
  Filled 2019-02-09 (×5): qty 1

## 2019-02-09 MED ORDER — IPRATROPIUM-ALBUTEROL 0.5-2.5 (3) MG/3ML IN SOLN
3.0000 mL | Freq: Four times a day (QID) | RESPIRATORY_TRACT | Status: DC
  Administered 2019-02-09 – 2019-02-13 (×14): 3 mL via RESPIRATORY_TRACT
  Filled 2019-02-09 (×15): qty 3

## 2019-02-09 MED ORDER — SODIUM CHLORIDE 0.9 % IV SOLN: 2.0000 g | Freq: Once | INTRAVENOUS | Status: DC

## 2019-02-09 MED ORDER — SODIUM CHLORIDE 0.9 % IV BOLUS (SEPSIS): 1000.0000 mL | Freq: Once | INTRAVENOUS | Status: DC

## 2019-02-09 MED ORDER — ENOXAPARIN SODIUM 40 MG/0.4ML ~~LOC~~ SOLN
40.0000 mg | Freq: Two times a day (BID) | SUBCUTANEOUS | Status: DC
  Administered 2019-02-09 – 2019-02-12 (×8): 40 mg via SUBCUTANEOUS
  Filled 2019-02-09 (×8): qty 0.4

## 2019-02-09 MED ORDER — HYDROXYCHLOROQUINE SULFATE 200 MG PO TABS
400.0000 mg | ORAL_TABLET | Freq: Every day | ORAL | Status: DC
  Administered 2019-02-09 – 2019-02-12 (×4): 400 mg via ORAL
  Filled 2019-02-09 (×5): qty 2

## 2019-02-09 MED ORDER — PANTOPRAZOLE SODIUM 20 MG PO TBEC
20.0000 mg | DELAYED_RELEASE_TABLET | Freq: Every day | ORAL | Status: DC
  Administered 2019-02-09 – 2019-02-13 (×5): 20 mg via ORAL
  Filled 2019-02-09 (×5): qty 1

## 2019-02-09 MED ORDER — VENLAFAXINE HCL ER 75 MG PO CP24
75.0000 mg | ORAL_CAPSULE | Freq: Every day | ORAL | Status: DC
  Administered 2019-02-09 – 2019-02-13 (×5): 75 mg via ORAL
  Filled 2019-02-09 (×5): qty 1

## 2019-02-09 MED ORDER — SODIUM CHLORIDE 0.9 % IV SOLN
INTRAVENOUS | Status: DC | PRN
  Administered 2019-02-09: 250 mL via INTRAVENOUS

## 2019-02-09 MED ORDER — SODIUM CHLORIDE 0.9% FLUSH
3.0000 mL | Freq: Two times a day (BID) | INTRAVENOUS | Status: DC
  Administered 2019-02-09 – 2019-02-12 (×8): 3 mL via INTRAVENOUS

## 2019-02-09 MED ORDER — IOHEXOL 350 MG/ML SOLN
75.0000 mL | Freq: Once | INTRAVENOUS | Status: AC | PRN
  Administered 2019-02-09: 75 mL via INTRAVENOUS

## 2019-02-09 MED ORDER — ALBUTEROL SULFATE HFA 108 (90 BASE) MCG/ACT IN AERS
1.0000 | INHALATION_SPRAY | RESPIRATORY_TRACT | Status: DC | PRN
  Administered 2019-02-09: 2 via RESPIRATORY_TRACT
  Filled 2019-02-09 (×2): qty 6.7

## 2019-02-09 MED ORDER — METRONIDAZOLE IN NACL 5-0.79 MG/ML-% IV SOLN
500.0000 mg | Freq: Three times a day (TID) | INTRAVENOUS | Status: DC
  Administered 2019-02-09 – 2019-02-10 (×4): 500 mg via INTRAVENOUS
  Filled 2019-02-09 (×6): qty 100

## 2019-02-09 MED ORDER — FAMOTIDINE 20 MG PO TABS
20.0000 mg | ORAL_TABLET | Freq: Two times a day (BID) | ORAL | Status: DC
  Administered 2019-02-09 – 2019-02-13 (×9): 20 mg via ORAL
  Filled 2019-02-09 (×9): qty 1

## 2019-02-09 MED ORDER — MIRTAZAPINE 15 MG PO TABS
15.0000 mg | ORAL_TABLET | Freq: Every day | ORAL | Status: DC
  Administered 2019-02-09 – 2019-02-12 (×4): 15 mg via ORAL
  Filled 2019-02-09 (×4): qty 1

## 2019-02-09 MED ORDER — LORATADINE 10 MG PO TABS
10.0000 mg | ORAL_TABLET | Freq: Every day | ORAL | Status: DC
  Administered 2019-02-10 – 2019-02-13 (×4): 10 mg via ORAL
  Filled 2019-02-09 (×5): qty 1

## 2019-02-09 MED ORDER — HYDROXYZINE HCL 25 MG PO TABS: 25.0000 mg | ORAL_TABLET | Freq: Three times a day (TID) | ORAL | Status: DC | PRN

## 2019-02-09 NOTE — Consult Note (Signed)
Pharmacy Antibiotic Note  Sarah Warner is a 53 y.o. female admitted on 02/08/2019 with sepsis.  Pharmacy has been consulted for Cefepime and Vancomycin dosing.  Plan: Vancomycin 2000 mg IV x 1 dose given in ED.  Start Vancomycin 1500 mg IV Q 24 hrs. Goal AUC 400-550. Expected AUC: 495 Expected C min: 10.9 SCr used: 1.00  Continue cefepime 2g IV every 8 hours   Will order SCr for AM to continue to monitor renal function.   Height: 5' 5.5" (166.4 cm) Weight: 247 lb 8 oz (112.3 kg) IBW/kg (Calculated) : 58.15  Temp (24hrs), Avg:99.5 F (37.5 C), Min:97.7 F (36.5 C), Max:102.6 F (39.2 C)  Recent Labs  Lab 02/08/19 2055 02/09/19 0044 02/09/19 0433  WBC 22.6*  --  24.0*  CREATININE 1.07*  --  1.00  LATICACIDVEN 1.7 1.2 1.0    Estimated Creatinine Clearance: 82 mL/min (by C-G formula based on SCr of 1 mg/dL).    Allergies  Allergen Reactions  . Nsaids Swelling  . Erythromycin Rash    Antimicrobials this admission: 9/13 Flagyl >>  9/13 Vancomycin  >>  9/13 Cefepime>>  Microbiology results: 9/13 BCx: pending 9/13 UCx: pending 9/13 SARS Coronavirus 2: negative   Thank you for allowing pharmacy to be a part of this patient's care.  Rocky Morel, PharmD, BCPS Clinical Pharmacist 02/09/2019 8:03 AM

## 2019-02-09 NOTE — Progress Notes (Signed)
PHARMACIST - PHYSICIAN COMMUNICATION  CONCERNING:  Enoxaparin (Lovenox) for DVT Prophylaxis   RECOMMENDATION: Patient was prescribed enoxaprin 40mg  q24 hours for VTE prophylaxis.   Filed Weights   02/08/19 2132  Weight: 246 lb (111.6 kg)    Body mass index is 40.31 kg/m.  Estimated Creatinine Clearance: 76.4 mL/min (A) (by C-G formula based on SCr of 1.07 mg/dL (H)).  Based on [place] patient is candidate for enoxaparin 40mg  every 12 hour dosing due to BMI being >40.  DESCRIPTION: Pharmacy has adjusted enoxaparin dose per [facility] policy.  Patient is now receiving enoxaparin 40mg  every 12 hours.   Pernell Dupre, PharmD, BCPS Clinical Pharmacist 02/09/2019 12:27 AM

## 2019-02-09 NOTE — Progress Notes (Signed)
*  PRELIMINARY RESULTS* Echocardiogram 2D Echocardiogram has been performed.  Sarah Warner 02/09/2019, 1:57 PM

## 2019-02-09 NOTE — Progress Notes (Addendum)
[facility] - [place] [facility]   PATIENT NAME: Sarah Warner    MR#:  [id number]  DATE OF BIRTH:  Mar 17, 1966  SUBJECTIVE:   Patient states she is feeling a little bit better this morning.  She still remains mildly short of breath.  She states that her shortness of breath is worse with ambulation.  She denies any orthopnea or lower extremity edema.  She endorses some left-sided "chest discomfort".  REVIEW OF SYSTEMS:  Review of Systems  Constitutional: Negative for chills and fever.  HENT: Negative for congestion and sore throat.   Eyes: Negative for blurred vision and double vision.  Respiratory: Positive for shortness of breath. Negative for cough.   Cardiovascular: Positive for chest pain. Negative for palpitations and leg swelling.  Gastrointestinal: Negative for nausea and vomiting.  Genitourinary: Negative for dysuria and urgency.  Musculoskeletal: Negative for back pain and neck pain.  Neurological: Negative for dizziness and headaches.  Psychiatric/Behavioral: Negative for depression. The patient is not nervous/anxious.     DRUG ALLERGIES:   Allergies  Allergen Reactions  . Nsaids Swelling  . Erythromycin Rash   VITALS:  Blood pressure (!) 111/57, pulse 73, temperature 97.8 F (36.6 C), temperature source Oral, resp. rate 20, height 5' 5.5" (1.664 m), weight 112.3 kg, SpO2 99 %. PHYSICAL EXAMINATION:  Physical Exam  GENERAL:  Laying in the bed with no acute distress.  HEENT: Head atraumatic, normocephalic. Pupils equal, round, reactive to light and accommodation. No scleral icterus. Extraocular muscles intact. Oropharynx and nasopharynx clear.  NECK:  Supple, no jugular venous distention. No thyroid enlargement. LUNGS: + Right lung base crackles, inspiratory wheezing present in the upper lobes.  Nasal cannula in place.  Mild to moderately increased work of breathing. CARDIOVASCULAR: RRR, S1, S2 normal. No murmurs, rubs, or gallops.  ABDOMEN:  Soft, nontender, nondistended. Bowel sounds present.  EXTREMITIES: No pedal edema, cyanosis, or clubbing.  NEUROLOGIC: CN 2-12 intact, no focal deficits. 5/5 muscle strength throughout all extremities. Sensation intact throughout. Gait not checked.  PSYCHIATRIC: The patient is alert and oriented x 3.  SKIN: No obvious rash, lesion, or ulcer.  LABORATORY PANEL:  Female CBC Recent Labs  Lab 02/09/19 0433  WBC 24.0*  HGB 11.6*  HCT 35.5*  PLT 460*   ------------------------------------------------------------------------------------------------------------------ Chemistries  Recent Labs  Lab 02/08/19 2055 02/09/19 0433  NA 136 137  K 3.4* 3.9  CL 107 110  CO2 18* 19*  GLUCOSE 118* 148*  BUN 15 13  CREATININE 1.07* 1.00  CALCIUM 9.6 8.9  AST 23  --   ALT 12  --   ALKPHOS 64  --   BILITOT 0.7  --    RADIOLOGY:  Dg Chest 2 View  Result Date: 02/09/2019 CLINICAL DATA:  Shortness of breath and cough. EXAM: CHEST - 2 VIEW COMPARISON:  02/08/2019 FINDINGS: The heart size appears normal. No pleural effusion or edema. Decreased lung volumes with persistent bibasilar atelectasis. The visualized osseous structures appear intact. IMPRESSION: 1. Unchanged low lung volumes with bibasilar atelectasis. Electronically Signed   By: Taylor  Stroud M.D.   On: 02/09/2019 08:43   Ct Angio Chest Pe W Or Wo Contrast  Result Date: 02/09/2019 CLINICAL DATA:  Shortness of breath on exertion EXAM: CT ANGIOGRAPHY CHEST WITH CONTRAST TECHNIQUE: Multidetector CT imaging of the chest was performed using the standard protocol during bolus administration of intravenous contrast. Multiplanar CT image reconstructions and MIPs were obtained to evaluate the [id number]  350 MG/ML SOLN COMPARISON:  Chest x-ray today FINDINGS: Cardiovascular: No filling defects in the pulmonary arteries to suggest pulmonary emboli. Heart is normal size. Aorta is normal caliber. Mediastinum/Nodes:  No mediastinal, hilar, or axillary adenopathy. Trachea and esophagus are unremarkable. Thyroid unremarkable. Small hiatal hernia. Lungs/Pleura: Bibasilar atelectasis.  No effusions. Upper Abdomen: Imaging into the upper abdomen shows no acute findings. Musculoskeletal: Chest wall soft tissues are unremarkable. No acute bony abnormality. Review of the MIP images confirms the above findings. IMPRESSION: No evidence of pulmonary embolus. Bibasilar atelectasis. Electronically Signed   By: Rolm Baptise M.D.   On: 02/09/2019 12:12   Dg Chest Port 1 View  Result Date: 02/08/2019 CLINICAL DATA:  Shortness of breath EXAM: PORTABLE CHEST 1 VIEW COMPARISON:  01/05/2012 FINDINGS: Mildly low lung volumes. Streaky opacity at the bases. Normal heart size. No pneumothorax. IMPRESSION: Low lung volumes with streaky basilar opacities, favored to represent subsegmental atelectasis Electronically Signed   By: Donavan Foil M.D.   On: 02/08/2019 20:52   ASSESSMENT AND PLAN:   Sepsis-possibly due to viral etiology.  Meeting sepsis criteria on admission with fever, tachycardia, tachypnea, and leukocytosis.  Sepsis is resolving. -Repeat 2 view CXR without signs of pneumonia -Continue empiric antibiotics for now -Follow-up blood and urine cultures -Check procalcitonin  Acute hypoxic respiratory failure- due to unclear etiology. Possible COPD exacerbation, as patient endorses life long second hand smoke exposure. She has been treated for pneumonia as an outpatient with doxycycline. Remains on 2L O2 this morning. -Wean O2 as able -Check ECHO and BNP -Check CTA chest to rule out PE -RVP ordered -Check troponin to rule out cardiac etiology -Continue IV steroids -Add duonebs every 6 hours -Incentive spirometry  Depression-stable -Continue home meds  Microcytic anemia -Check anemia panel  Thrombocytosis- chronic -Check peripheral smear  All the records are reviewed and case discussed with Care Management/Social  Worker. Management plans discussed with the patient, family and they are in agreement.  CODE STATUS: Full Code  TOTAL TIME TAKING CARE OF THIS PATIENT: 45 minutes.   More than 50% of the time was spent in counseling/coordination of care: YES  POSSIBLE D/C IN 2-3 DAYS, DEPENDING ON CLINICAL CONDITION.   Berna Spare Quita Mcgrory M.D on 02/09/2019 at 2:09 PM  Between 7am to 6pm - Pager - [phone]  After 6pm go to [email or web address] - password [email or web address] [facility]  [facility]  Office  [phone]  CC: Primary care physician; Dartha Lodge  Note: This dictation was prepared with Dragon dictation along with smaller phrase technology. Any transcriptional errors that result from this process are unintentional.

## 2019-02-10 ENCOUNTER — Inpatient Hospital Stay: Payer: BC Managed Care – PPO

## 2019-02-10 LAB — CBC
HCT: 35 % — ABNORMAL LOW (ref 36.0–46.0)
Hemoglobin: 11.4 g/dL — ABNORMAL LOW (ref 12.0–15.0)
MCH: 25.9 pg — ABNORMAL LOW (ref 26.0–34.0)
MCHC: 32.6 g/dL (ref 30.0–36.0)
MCV: 79.4 fL — ABNORMAL LOW (ref 80.0–100.0)
Platelets: 515 10*3/uL — ABNORMAL HIGH (ref 150–400)
RBC: 4.41 MIL/uL (ref 3.87–5.11)
RDW: 15.3 % (ref 11.5–15.5)
WBC: 28.7 10*3/uL — ABNORMAL HIGH (ref 4.0–10.5)
nRBC: 0 % (ref 0.0–0.2)

## 2019-02-10 LAB — IRON AND TIBC
Iron: 52 ug/dL (ref 28–170)
Saturation Ratios: 22 % (ref 10.4–31.8)
TIBC: 240 ug/dL — ABNORMAL LOW (ref 250–450)
UIBC: 188 ug/dL

## 2019-02-10 LAB — CREATININE, SERUM
Creatinine, Ser: 0.9 mg/dL (ref 0.44–1.00)
GFR calc Af Amer: >60 mL/min (ref >60)
GFR calc non Af Amer: >60 mL/min (ref >60)

## 2019-02-10 LAB — URINE CULTURE: Culture: <10000 — AB

## 2019-02-10 LAB — VITAMIN B12: Vitamin B-12: 538 pg/mL (ref 180–914)

## 2019-02-10 LAB — ECHOCARDIOGRAM COMPLETE
Height: 65.5 in
Weight: 3960 oz

## 2019-02-10 LAB — FERRITIN: Ferritin: 149 ng/mL (ref 11–307)

## 2019-02-10 LAB — PATHOLOGIST SMEAR REVIEW

## 2019-02-10 LAB — FOLATE: Folate: 5.2 ng/mL — ABNORMAL LOW (ref >5.9)

## 2019-02-10 LAB — HIV ANTIBODY (ROUTINE TESTING W REFLEX): HIV Screen 4th Generation wRfx: NONREACTIVE

## 2019-02-10 MED ORDER — ALBUTEROL SULFATE (2.5 MG/3ML) 0.083% IN NEBU: 2.5000 mg | INHALATION_SOLUTION | Freq: Once | RESPIRATORY_TRACT | Status: DC

## 2019-02-10 MED ORDER — METHYLPREDNISOLONE SODIUM SUCC 125 MG IJ SOLR
60.0000 mg | Freq: Two times a day (BID) | INTRAMUSCULAR | Status: AC
  Administered 2019-02-10 – 2019-02-12 (×5): 60 mg via INTRAVENOUS
  Filled 2019-02-10 (×5): qty 2

## 2019-02-10 NOTE — Progress Notes (Signed)
[place] at [place] NAME: Sarah Warner    MR#:  [id number]  DATE OF BIRTH:  07-22-1965  SUBJECTIVE:   Patient states she is feeling somewhat better this am. She still has to pause at times while talking but is sitting up in bed breathing comfortably on 2L of O2NC. She is not having chest pains and currently is not SOB but does get SOB on exertion.  REVIEW OF SYSTEMS:  Review of Systems  Constitutional: Negative for chills and fever.  HENT: Negative for congestion and sore throat.   Eyes: Negative for blurred vision and double vision.  Respiratory: Positive for shortness of breath. Negative for cough.   Cardiovascular: Positive for chest pain. Negative for palpitations and leg swelling.  Gastrointestinal: Negative for nausea and vomiting.  Genitourinary: Negative for dysuria and urgency.  Musculoskeletal: Negative for back pain and neck pain.  Neurological: Negative for dizziness and headaches.  Psychiatric/Behavioral: Negative for depression. The patient is not nervous/anxious.    DRUG ALLERGIES:   Allergies  Allergen Reactions  . Nsaids Swelling  . Erythromycin Rash   VITALS:  Blood pressure 126/64, pulse 82, temperature 98 F (36.7 C), temperature source Oral, resp. rate 18, height 5' 5.5" (1.664 m), weight 113.5 kg, SpO2 97 %. PHYSICAL EXAMINATION:  Physical Exam  Gen: Alert and Oriented x 3, NAD HEENT: Normocephalic, atraumatic, PERRLA, EOMI CV: RRR, no murmurs, normal S1, S2 split Resp: right basilar lung field positive for crackles, otherwise clear Abd: non-distended, non-tender, soft, +bs in all four quadrants Ext: no clubbing, cyanosis, or edema Skin: warm, dry, intact, no rashes  LABORATORY PANEL:  Female CBC Recent Labs  Lab 02/10/19 0453  WBC 28.7*  HGB 11.4*  HCT 35.0*  PLT 515*   ------------------------------------------------------------------------------------------------------------------ Chemistries   Recent Labs  Lab 02/08/19 2055 02/09/19 0433 02/10/19 0453  NA 136 137  --   K 3.4* 3.9  --   CL 107 110  --   CO2 18* 19*  --   GLUCOSE 118* 148*  --   BUN 15 13  --   CREATININE 1.07* 1.00 0.90  CALCIUM 9.6 8.9  --   AST 23  --   --   ALT 12  --   --   ALKPHOS 64  --   --   BILITOT 0.7  --   --    RADIOLOGY:  Ct Angio Chest Pe W Or Wo Contrast  Result Date: 02/09/2019 CLINICAL DATA:  Shortness of breath on exertion EXAM: CT ANGIOGRAPHY CHEST WITH CONTRAST TECHNIQUE: Multidetector CT imaging of the chest was performed using the standard protocol during bolus administration of intravenous contrast. Multiplanar CT image reconstructions and MIPs were obtained to evaluate the vascular anatomy. CONTRAST:  19m OMNIPAQUE IOHEXOL 350 MG/ML SOLN COMPARISON:  Chest x-ray today FINDINGS: Cardiovascular: No filling defects in the pulmonary arteries to suggest pulmonary emboli. Heart is normal size. Aorta is normal caliber. Mediastinum/Nodes: No mediastinal, hilar, or axillary adenopathy. Trachea and esophagus are unremarkable. Thyroid unremarkable. Small hiatal hernia. Lungs/Pleura: Bibasilar atelectasis.  No effusions. Upper Abdomen: Imaging into the upper abdomen shows no acute findings. Musculoskeletal: Chest wall soft tissues are unremarkable. No acute bony abnormality. Review of the MIP images confirms the above findings. IMPRESSION: No evidence of pulmonary embolus. Bibasilar atelectasis. Electronically Signed   By: KRolm BaptiseM.D.   On: 02/09/2019 12:12   ASSESSMENT AND PLAN:   Sepsis - resolving. Afebrile in past 24 hours with mild  tachycardia and continued leukocytosis. Repeat CXR negative for infiltrate. RVP negative. Blood Culture negative x 2 days.Urine Culture grew less than 10k colonies of E. Coli.  - Stopping antibiotics - Cont daily CBC  Acute hypoxic respiratory failure- due to unclear etiology. Possible COPD exacerbation, as patient endorses life long second hand smoke  exposure. She has been treated for pneumonia as an outpatient with doxycycline. Continues on 2L [place] of O2. Echo pending; BNP normal. CTA chest negative for PE. Troponin negative.  -Wean O2 as able - ECHO pending - Obtain PFTs to look for restrictive pattern lung disease consistent with autoimmune process - Consider ordering ESR, CRP, ACE level - Pulmonology consulted; appreciate recs - Taper IV steroids to solumedrol 97m BID today - Cont duonebs every 6 hours - Cont Incentive spirometry  Depression-stable -Continue home meds  Microcytic anemia. Folate is low at 5.2, Ferritin normal at 149, Iron and TIBC unremarkable. - Vit B12 pending   Thrombocytosis- chronic - peripheral smear pending  All the records are reviewed and case discussed with Care Management/Social Worker. Management plans discussed with the patient, family and they are in agreement.  CODE STATUS: Full Code  TOTAL TIME TAKING CARE OF THIS PATIENT: 45 minutes.   More than 50% of the time was spent in counseling/coordination of care: YES  POSSIBLE D/C IN 2-3 DAYS, DEPENDING ON CLINICAL CONDITION.   TNuala AlphaM.D on 02/10/2019 at 10:41 AM  Between 7am to 6pm - Pager - [phone] After 6pm go to [email or web address] - password [email or web address] [place] [facility]  Office  [phone] CC: Primary care physician; GDartha Lodge Note: This dictation was prepared with Dragon dictation along with smaller phrase technology. Any transcriptional errors that result from this process are unintentional.

## 2019-02-10 NOTE — Consult Note (Signed)
Pulmonary Medicine          Date: 02/10/2019,   MRN# [id number] Sarah Warner August 25, 1965     AdmissionWeight: 111.6 kg                 CurrentWeight: 113.5 kg      CHIEF COMPLAINT:   Cough and shortness of breath   HISTORY OF PRESENT ILLNESS   This is a pleasant 53 year old female with a history of major depression, GERD, chronic hives, chronic migraines.  She was admitted due to 1 week worth of cough with a flulike illness initially seen at an urgent care facility and had a x-ray done followed by CT chest due to abnormal findings on initial film.  She was tested for novel coronavirus which was negative.  She does report fevers chills and shortness of breath which has gotten worse over the last few days prior to admission.  While in the ED she was found to be tachypneic tachycardic with mild hypoxemia and leukocytosis on peripheral blood.  Her ferritin was normal.  CBC shows leukocytosis of 24,000 with a mild microcytic anemia and I BMP which is essentially normal.  Procalcitonin is mildly elevated at 0.12-0.16.  CT chest shows basal appearing dominant mild groundglass attenuation bilaterally with absence of pulmonary embolism and any nodular findings as well as absence of bullae/emphysema/bronchiectasis/tumors masses or infiltrates or effusions.  While here in the hospital patient was febrile to 102.6, and tachycardic with a pulse over 148 and tachypneic with a respiratory rate of over 32.  She was mildly hypotensive throughout this time with a pressure of 90s over 70.  She does have a transthoracic echo that is in process and her brain natruretic peptide was 35.  Her oxygen requirement has been between 2 to 4 L/min nasal cannula.  She had a respiratory viral panel done which is negative.  She has been on multiple courses of prednisone and plaquenil with vistaril.  She finished 3 week prednisone regimen appx 1 month ago.  She was seen for similar cough few weeks ago and was told  this may be related to LPR from GERD but patient states this feels very different from her previous GERD symptoms.  PAST MEDICAL HISTORY   Past Medical History:  Diagnosis Date   Depression    GERD (gastroesophageal reflux disease)    Hives    Migraines      SURGICAL HISTORY   Past Surgical History:  Procedure Laterality Date   CESAREAN SECTION     KNEE ARTHROSCOPY       FAMILY HISTORY   History reviewed. No pertinent family history.   SOCIAL HISTORY   Social History   Tobacco Use   Smoking status: Never Smoker   Smokeless tobacco: Never Used  Substance Use Topics   Alcohol use: Yes    Comment: social   Drug use: No     MEDICATIONS    Home Medication:    Current Medication:  Current Facility-Administered Medications:    0.9 %  sodium chloride infusion, , Intravenous, PRN, Mayo, Allyn KennerKaty Dodd, MD, Last Rate: 10 mL/hr at 02/10/19 0600   albuterol (VENTOLIN HFA) 108 (90 Base) MCG/ACT inhaler 1-2 puff, 1-2 puff, Inhalation, Q4H PRN, Mayo, Allyn KennerKaty Dodd, MD, 2 puff at 02/09/19 1314   enoxaparin (LOVENOX) injection 40 mg, 40 mg, Subcutaneous, Q12H, Delfino LovettShah, Vipul, MD, 40 mg at 02/09/19 2127   famotidine (PEPCID) tablet 20 mg, 20 mg, Oral, BID, Mayo, Allyn KennerKaty Dodd, MD, 20  mg at 02/09/19 2126   hydroxychloroquine (PLAQUENIL) tablet 400 mg, 400 mg, Oral, QHS, Mayo, Pete Pelt, MD, 400 mg at 02/09/19 2126   ipratropium-albuterol (DUONEB) 0.5-2.5 (3) MG/3ML nebulizer solution 3 mL, 3 mL, Nebulization, Q6H, Mayo, Pete Pelt, MD, 3 mL at 02/10/19 0754   loratadine (CLARITIN) tablet 10 mg, 10 mg, Oral, Daily, Mayo, Pete Pelt, MD   methylPREDNISolone sodium succinate (SOLU-MEDROL) 125 mg/2 mL injection 60 mg, 60 mg, Intravenous, Q12H, Mayo, Pete Pelt, MD   mirtazapine (REMERON) tablet 15 mg, 15 mg, Oral, QHS, Mayo, Pete Pelt, MD, 15 mg at 02/09/19 2126   pantoprazole (PROTONIX) EC tablet 20 mg, 20 mg, Oral, Daily, Mayo, Pete Pelt, MD, 20 mg at 02/09/19 1314   sertraline  (ZOLOFT) tablet 100 mg, 100 mg, Oral, Daily, Mayo, Pete Pelt, MD, 100 mg at 02/09/19 1314   sodium chloride 0.9 % bolus 1,000 mL, 1,000 mL, Intravenous, Once **AND** sodium chloride 0.9 % bolus 1,000 mL, 1,000 mL, Intravenous, Once **AND** sodium chloride 0.9 % bolus 1,000 mL, 1,000 mL, Intravenous, Once **AND** sodium chloride 0.9 % bolus 500 mL, 500 mL, Intravenous, Once, Manuella Ghazi, Vipul, MD   sodium chloride flush (NS) 0.9 % injection 3 mL, 3 mL, Intravenous, Q12H, Mayo, Pete Pelt, MD, 3 mL at 02/09/19 2140   topiramate (TOPAMAX) tablet 100 mg, 100 mg, Oral, QHS, Mayo, Pete Pelt, MD, 100 mg at 02/09/19 2126   venlafaxine XR (EFFEXOR-XR) 24 hr capsule 75 mg, 75 mg, Oral, Daily, Mayo, Pete Pelt, MD, 75 mg at 02/09/19 1314    ALLERGIES   Nsaids and Erythromycin     REVIEW OF SYSTEMS    Review of Systems:  Gen:  Denies  fever, sweats, chills weigh loss  HEENT: Denies blurred vision, double vision, ear pain, eye pain, hearing loss, nose bleeds, sore throat Cardiac:  No dizziness, chest pain or heaviness, chest tightness,edema Resp:   Denies cough or sputum porduction, shortness of breath,wheezing, hemoptysis,  Gi: Denies swallowing difficulty, stomach pain, nausea or vomiting, diarrhea, constipation, bowel incontinence Gu:  Denies bladder incontinence, burning urine Ext:   Denies Joint pain, stiffness or swelling Skin: Denies  skin rash, easy bruising or bleeding or hives Endoc:  Denies polyuria, polydipsia , polyphagia or weight change Psych:   Denies depression, insomnia or hallucinations   Other:  All other systems negative   VS: BP 126/64 (BP Location: Left Arm)    Pulse 82    Temp 98 F (36.7 C) (Oral)    Resp 18    Ht 5' 5.5" (1.664 m)    Wt 113.5 kg    LMP  (LMP Unknown)    SpO2 97%    BMI 41.02 kg/m      PHYSICAL EXAM    GENERAL:NAD, no fevers, chills, no weakness no fatigue HEAD: Normocephalic, atraumatic.  EYES: Pupils equal, round, reactive to light. Extraocular  muscles intact. No scleral icterus.  MOUTH: Moist mucosal membrane. Dentition intact. No abscess noted.  EAR, NOSE, THROAT: Clear without exudates. No external lesions.  NECK: Supple. No thyromegaly. No nodules. No JVD.  PULMONARY: Diffuse coarse rhonchi right sided +wheezes CARDIOVASCULAR: S1 and S2. Regular rate and rhythm. No murmurs, rubs, or gallops. No edema. Pedal pulses 2+ bilaterally.  GASTROINTESTINAL: Soft, nontender, nondistended. No masses. Positive bowel sounds. No hepatosplenomegaly.  MUSCULOSKELETAL: No swelling, clubbing, or edema. Range of motion full in all extremities.  NEUROLOGIC: Cranial nerves II through XII are intact. No gross focal neurological deficits. Sensation intact. Reflexes intact.  SKIN:  No ulceration, lesions, rashes, or cyanosis. Skin warm and dry. Turgor intact.  PSYCHIATRIC: Mood, affect within normal limits. The patient is awake, alert and oriented x 3. Insight, judgment intact.       IMAGING    Dg Chest 2 View  Result Date: 02/09/2019 CLINICAL DATA:  Shortness of breath and cough. EXAM: CHEST - 2 VIEW COMPARISON:  02/08/2019 FINDINGS: The heart size appears normal. No pleural effusion or edema. Decreased lung volumes with persistent bibasilar atelectasis. The visualized osseous structures appear intact. IMPRESSION: 1. Unchanged low lung volumes with bibasilar atelectasis. Electronically Signed   By: Signa Kell M.D.   On: 02/09/2019 08:43   Ct Angio Chest Pe W Or Wo Contrast  Result Date: 02/09/2019 CLINICAL DATA:  Shortness of breath on exertion EXAM: CT ANGIOGRAPHY CHEST WITH CONTRAST TECHNIQUE: Multidetector CT imaging of the chest was performed using the standard protocol during bolus administration of intravenous contrast. Multiplanar CT image reconstructions and MIPs were obtained to evaluate the vascular anatomy. CONTRAST:  31mL OMNIPAQUE IOHEXOL 350 MG/ML SOLN COMPARISON:  Chest x-ray today FINDINGS: Cardiovascular: No filling defects in the  pulmonary arteries to suggest pulmonary emboli. Heart is normal size. Aorta is normal caliber. Mediastinum/Nodes: No mediastinal, hilar, or axillary adenopathy. Trachea and esophagus are unremarkable. Thyroid unremarkable. Small hiatal hernia. Lungs/Pleura: Bibasilar atelectasis.  No effusions. Upper Abdomen: Imaging into the upper abdomen shows no acute findings. Musculoskeletal: Chest wall soft tissues are unremarkable. No acute bony abnormality. Review of the MIP images confirms the above findings. IMPRESSION: No evidence of pulmonary embolus. Bibasilar atelectasis. Electronically Signed   By: Charlett Nose M.D.   On: 02/09/2019 12:12   Dg Chest Port 1 View  Result Date: 02/08/2019 CLINICAL DATA:  Shortness of breath EXAM: PORTABLE CHEST 1 VIEW COMPARISON:  01/05/2012 FINDINGS: Mildly low lung volumes. Streaky opacity at the bases. Normal heart size. No pneumothorax. IMPRESSION: Low lung volumes with streaky basilar opacities, favored to represent subsegmental atelectasis Electronically Signed   By: Jasmine Pang M.D.   On: 02/08/2019 20:52      ASSESSMENT/PLAN   Acute hypoxemic respiratory failure   - Patient has no chronic lung disease hx but does have unclear autoimmune hx and has been on Plaquinel and prednisone with repeated courses of steroids.  She may have a viral prodrome vs possible opportunistic infectious etiology.   -  fungitell to check for fungal elements to check PJP -comprehensive ANA panel to eval for AI pulmonary dz - respiratory culture - Noted antibiotics and steroids IV which seem to be working as patient has not had any additional bouts of fevers - she is on multiple centrally acting medications which together do cause serotonin syndrome and EPS although I think drug induced etiology is lower down on differential. IgE to evaluate for chronic urticarial associated reactive airway disease.     Thank you for allowing me to participate in the care of this patient.    Patient/Family are satisfied with care plan and all questions have been answered.  This document was prepared using Dragon voice recognition software and may include unintentional dictation errors.     Vida Rigger, M.D.  Division of Pulmonary & Critical Care Medicine  [facility]

## 2019-02-10 NOTE — Progress Notes (Signed)
Patient brought to pulmonary function lab and complete pulmonary function testing performed. Patient was unable to complete a valid DLCO trial due to her WOB.  Patient returned to room with no complications and a copy of her PFT study was placed on her chart at the desk.

## 2019-02-11 ENCOUNTER — Encounter: Payer: Self-pay | Admitting: *Deleted

## 2019-02-11 LAB — BASIC METABOLIC PANEL
Anion gap: 7 (ref 5–15)
BUN: 18 mg/dL (ref 6–20)
CO2: 22 mmol/L (ref 22–32)
Calcium: 9.1 mg/dL (ref 8.9–10.3)
Chloride: 110 mmol/L (ref 98–111)
Creatinine, Ser: 0.78 mg/dL (ref 0.44–1.00)
GFR calc Af Amer: >60 mL/min (ref >60)
GFR calc non Af Amer: >60 mL/min (ref >60)
Glucose, Bld: 148 mg/dL — ABNORMAL HIGH (ref 70–99)
Potassium: 3.5 mmol/L (ref 3.5–5.1)
Sodium: 139 mmol/L (ref 135–145)

## 2019-02-11 LAB — ANA COMPREHENSIVE PANEL
Anti JO-1: <0.2 AI (ref 0.0–0.9)
Centromere Ab Screen: <0.2 AI (ref 0.0–0.9)
Chromatin Ab SerPl-aCnc: <0.2 AI (ref 0.0–0.9)
ENA SM Ab Ser-aCnc: <0.2 AI (ref 0.0–0.9)
Ribonucleic Protein: 0.3 AI (ref 0.0–0.9)
SSA (Ro) (ENA) Antibody, IgG: <0.2 AI (ref 0.0–0.9)
SSB (La) (ENA) Antibody, IgG: <0.2 AI (ref 0.0–0.9)
Scleroderma (Scl-70) (ENA) Antibody, IgG: 0.2 AI (ref 0.0–0.9)
ds DNA Ab: <1 IU/mL (ref 0–9)

## 2019-02-11 LAB — CBC
HCT: 34.4 % — ABNORMAL LOW (ref 36.0–46.0)
Hemoglobin: 11.3 g/dL — ABNORMAL LOW (ref 12.0–15.0)
MCH: 25.8 pg — ABNORMAL LOW (ref 26.0–34.0)
MCHC: 32.8 g/dL (ref 30.0–36.0)
MCV: 78.5 fL — ABNORMAL LOW (ref 80.0–100.0)
Platelets: 482 10*3/uL — ABNORMAL HIGH (ref 150–400)
RBC: 4.38 MIL/uL (ref 3.87–5.11)
RDW: 15.2 % (ref 11.5–15.5)
WBC: 26.9 10*3/uL — ABNORMAL HIGH (ref 4.0–10.5)
nRBC: 0 % (ref 0.0–0.2)

## 2019-02-11 MED ORDER — ORAL CARE MOUTH RINSE
15.0000 mL | Freq: Two times a day (BID) | OROMUCOSAL | Status: DC
  Administered 2019-02-11 – 2019-02-12 (×3): 15 mL via OROMUCOSAL

## 2019-02-11 NOTE — Progress Notes (Addendum)
[employer] [facility] [facility] [place] [facility]   PATIENT NAME: Sarah BrownsCarla Warner    MR#:  [id number]  DATE OF BIRTH:  May 18, 1966  SUBJECTIVE:   Patient states she is feeling better today than yesterday. Her breathing feels better and she can talk longer periods without getting SOB. She will still get SOB on ambulation of short distances and even worse if she tries to talk and walk. No pain or discomfort this am.  REVIEW OF SYSTEMS:  Review of Systems  Constitutional: Negative for chills and fever.  HENT: Negative for congestion and sore throat.   Eyes: Negative for blurred vision and double vision.  Respiratory: Positive for shortness of breath. Negative for cough.   Cardiovascular: Positive for chest pain. Negative for palpitations and leg swelling.  Gastrointestinal: Negative for nausea and vomiting.  Genitourinary: Negative for dysuria and urgency.  Musculoskeletal: Negative for back pain and neck pain.  Neurological: Negative for dizziness and headaches.  Psychiatric/Behavioral: Negative for depression. The patient is not nervous/anxious.    DRUG ALLERGIES:   Allergies  Allergen Reactions  . Nsaids Swelling  . Erythromycin Rash   VITALS:  Blood pressure (!) 144/82, pulse 78, temperature 98.7 F (37.1 C), temperature source Oral, resp. rate 18, height 5' 5.5" (1.664 m), weight 113.5 kg, SpO2 98 %. PHYSICAL EXAMINATION:  Physical Exam  Gen: Alert and Oriented x 3, NAD CV: RRR, no murmurs, normal S1, S2 split Resp: mild wheezing diffusely and some crackles at the lung bases Abd: non-distended, non-tender, soft, +bs in all four quadrants Ext: no clubbing, cyanosis, or edema Skin: warm, dry, intact, no rashes  LABORATORY PANEL:  Female CBC Recent Labs  Lab 02/11/19 0541  WBC 26.9*  HGB 11.3*  HCT 34.4*  PLT 482*   ------------------------------------------------------------------------------------------------------------------ Chemistries  Recent Labs  Lab  02/08/19 2055  02/11/19 0541  NA 136   < > 139  K 3.4*   < > 3.5  CL 107   < > 110  CO2 18*   < > 22  GLUCOSE 118*   < > 148*  BUN 15   < > 18  CREATININE 1.07*   < > 0.78  CALCIUM 9.6   < > 9.1  AST 23  --   --   ALT 12  --   --   ALKPHOS 64  --   --   BILITOT 0.7  --   --    < > = values in this interval not displayed.   RADIOLOGY:  No results found. ASSESSMENT AND PLAN:   Acute hypoxic respiratory failure - still unclear etiology. Possible COPD exacerbation, however given her history of autoimmune disease in the family with an unclear history of hives could be some type of interstitial lung disease vs sarcoidosis. However, CT of her chest showed no findings consistent with this. Looks less like an active infection but will rule out PJP PNA. Leukocytosis could be from steroids. Continues on 2-3L [place] of O2. Echo was unremarkable; BNP normal. CTA chest negative for PE. Troponin negative. PFTs were not able to be completed due to patient WOB.  - Wean O2 as able today - Pulmonology consulted; PJP PNA considered vs reactive airway disease from chronic urticariaa. Will follow up on labs ordered inpatient - Cont IV steroids to solumedrol 60mg  BID today and convert to oral prednisone tomorrow if continued improvement - Cont duonebs every 6 hours - Cont Incentive spirometry  Depression - stable, no active symptoms. -Continue home meds:  sertraline 100mg , remeron 15mg  and effexor 75mg  daily.  Microcytic anemia. Folate is low at 5.2, Vit B12 normal at 538, Ferritin normal at 149, Iron and TIBC unremarkable. - daily CBC - Consider restarting oral iron 325mg  daily on discharge as Ferritin could be falsely elevated due to inflammatory process  Thrombocytosis- chronic. Smear consistent with acute infectious process superimposed on chronic iron deficiency anemia. - peripheral smear pending  All the records are reviewed and case discussed with Care Management/Social Worker. Management plans  discussed with the patient, family and they are in agreement.  CODE STATUS: Full Code  TOTAL TIME TAKING CARE OF THIS PATIENT: 45 minutes.   More than 50% of the time was spent in counseling/coordination of care: YES  POSSIBLE D/C IN 2-3 DAYS, DEPENDING ON CLINICAL CONDITION.   Nuala Alpha M.D on 02/11/2019 at 10:34 AM  Between 7am to 6pm - Pager - [phone]  After 6pm go to [email or web address] - password [email or web address] [facility]  [facility]  Office  [phone]  CC: Primary care physician; Dartha Lodge  Note: This dictation was prepared with Dragon dictation along with smaller phrase technology. Any transcriptional errors that result from this process are unintentional.

## 2019-02-11 NOTE — Progress Notes (Signed)
Pulmonary Medicine          Date: 02/11/2019,   MRN# [id number] Sarah Warner [birth year]     AdmissionWeight: 111.6 kg                 CurrentWeight: 113.5 kg      CHIEF COMPLAINT:   Cough and shortness of breath   SUBJECTIVE    Patient states she feels better.  She is only able to inhale incentive spirometer to tidal volume of and requring 2L/min [place] to reach 88-90% spO2.  Septic workup is thus far unreveiling.   CRP is elevated again pointing at inflammatory etiology.  ANA comp pend  Pt was unable to complete PFT due to shallow breaths.     PAST MEDICAL HISTORY   Past Medical History:  Diagnosis Date  . Depression   . GERD (gastroesophageal reflux disease)   . Hives   . Migraines      SURGICAL HISTORY   Past Surgical History:  Procedure Laterality Date  . CESAREAN SECTION    . KNEE ARTHROSCOPY       FAMILY HISTORY   History reviewed. No pertinent family history.   SOCIAL HISTORY   Social History   Tobacco Use  . Smoking status: Never Smoker  . Smokeless tobacco: Never Used  Substance Use Topics  . Alcohol use: Yes    Comment: social  . Drug use: No     MEDICATIONS    Home Medication:    Current Medication:  Current Facility-Administered Medications:  .  0.9 %  sodium chloride infusion, , Intravenous, PRN, Mayo, Allyn Kenner, MD, Last Rate: 10 mL/hr at 02/10/19 0600 .  albuterol (PROVENTIL) (2.5 MG/3ML) 0.083% nebulizer solution 2.5 mg, 2.5 mg, Nebulization, Once, Lockamy, Timothy, DO .  albuterol (VENTOLIN HFA) 108 (90 Base) MCG/ACT inhaler 1-2 puff, 1-2 puff, Inhalation, Q4H PRN, Mayo, Allyn Kenner, MD, 2 puff at 02/09/19 1314 .  enoxaparin (LOVENOX) injection 40 mg, 40 mg, Subcutaneous, Q12H, Sherryll Burger, Vipul, MD, 40 mg at 02/11/19 0925 .  famotidine (PEPCID) tablet 20 mg, 20 mg, Oral, BID, Mayo, Allyn Kenner, MD, 20 mg at 02/11/19 5797 .  hydroxychloroquine (PLAQUENIL) tablet 400 mg, 400 mg, Oral, QHS, Mayo, Allyn Kenner, MD, 400  mg at 02/10/19 2200 .  ipratropium-albuterol (DUONEB) 0.5-2.5 (3) MG/3ML nebulizer solution 3 mL, 3 mL, Nebulization, Q6H, Mayo, Allyn Kenner, MD, 3 mL at 02/11/19 0753 .  loratadine (CLARITIN) tablet 10 mg, 10 mg, Oral, Daily, Mayo, Allyn Kenner, MD, 10 mg at 02/11/19 0925 .  MEDLINE mouth rinse, 15 mL, Mouth Rinse, BID, Mayo, Allyn Kenner, MD, 15 mL at 02/11/19 0925 .  methylPREDNISolone sodium succinate (SOLU-MEDROL) 125 mg/2 mL injection 60 mg, 60 mg, Intravenous, Q12H, Mayo, Allyn Kenner, MD, 60 mg at 02/11/19 2820 .  mirtazapine (REMERON) tablet 15 mg, 15 mg, Oral, QHS, Mayo, Allyn Kenner, MD, 15 mg at 02/10/19 2200 .  pantoprazole (PROTONIX) EC tablet 20 mg, 20 mg, Oral, Daily, Mayo, Allyn Kenner, MD, 20 mg at 02/11/19 0925 .  sertraline (ZOLOFT) tablet 100 mg, 100 mg, Oral, Daily, Mayo, Allyn Kenner, MD, 100 mg at 02/11/19 0925 .  sodium chloride 0.9 % bolus 1,000 mL, 1,000 mL, Intravenous, Once **AND** sodium chloride 0.9 % bolus 1,000 mL, 1,000 mL, Intravenous, Once **AND** sodium chloride 0.9 % bolus 1,000 mL, 1,000 mL, Intravenous, Once **AND** sodium chloride 0.9 % bolus 500 mL, 500 mL, Intravenous, Once, Sherryll Burger, Vipul, MD .  sodium chloride flush (NS)  0.9 % injection 3 mL, 3 mL, Intravenous, Q12H, Mayo, Pete Pelt, MD, 3 mL at 02/10/19 2200 .  topiramate (TOPAMAX) tablet 100 mg, 100 mg, Oral, QHS, Mayo, Pete Pelt, MD, 100 mg at 02/10/19 2200 .  venlafaxine XR (EFFEXOR-XR) 24 hr capsule 75 mg, 75 mg, Oral, Daily, Mayo, Pete Pelt, MD, 75 mg at 02/11/19 1062    ALLERGIES   Nsaids and Erythromycin     REVIEW OF SYSTEMS    Review of Systems:  Gen:  Denies  fever, sweats, chills weigh loss  HEENT: Denies blurred vision, double vision, ear pain, eye pain, hearing loss, nose bleeds, sore throat Cardiac:  No dizziness, chest pain or heaviness, chest tightness,edema Resp:   Denies cough or sputum porduction, shortness of breath,wheezing, hemoptysis,  Gi: Denies swallowing difficulty, stomach pain, nausea  or vomiting, diarrhea, constipation, bowel incontinence Gu:  Denies bladder incontinence, burning urine Ext:   Denies Joint pain, stiffness or swelling Skin: Denies  skin rash, easy bruising or bleeding or hives Endoc:  Denies polyuria, polydipsia , polyphagia or weight change Psych:   Denies depression, insomnia or hallucinations   Other:  All other systems negative   VS: BP (!) 144/82 (BP Location: Left Arm)   Pulse 78   Temp 98.7 F (37.1 C) (Oral)   Resp 18   Ht 5' 5.5" (1.664 m)   Wt 113.5 kg   LMP  (LMP Unknown)   SpO2 98%   BMI 41.02 kg/m      PHYSICAL EXAM    GENERAL:NAD, no fevers, chills, no weakness no fatigue HEAD: Normocephalic, atraumatic.  EYES: Pupils equal, round, reactive to light. Extraocular muscles intact. No scleral icterus.  MOUTH: Moist mucosal membrane. Dentition intact. No abscess noted.  EAR, NOSE, THROAT: Clear without exudates. No external lesions.  NECK: Supple. No thyromegaly. No nodules. No JVD.  PULMONARY: decreased air entry bilaterally , monophonic wheezing at right base.  CARDIOVASCULAR: S1 and S2. Regular rate and rhythm. No murmurs, rubs, or gallops. No edema. Pedal pulses 2+ bilaterally.  GASTROINTESTINAL: Soft, nontender, nondistended. No masses. Positive bowel sounds. No hepatosplenomegaly.  MUSCULOSKELETAL: No swelling, clubbing, or edema. Range of motion full in all extremities.  NEUROLOGIC: Cranial nerves II through XII are intact. No gross focal neurological deficits. Sensation intact. Reflexes intact.  SKIN: No ulceration, lesions, rashes, or cyanosis. Skin warm and dry. Turgor intact.  PSYCHIATRIC: Mood, affect within normal limits. The patient is awake, alert and oriented x 3. Insight, judgment intact.       IMAGING    Dg Chest 2 View  Result Date: 02/09/2019 CLINICAL DATA:  Shortness of breath and cough. EXAM: CHEST - 2 VIEW COMPARISON:  02/08/2019 FINDINGS: The heart size appears normal. No pleural effusion or edema.  Decreased lung volumes with persistent bibasilar atelectasis. The visualized osseous structures appear intact. IMPRESSION: 1. Unchanged low lung volumes with bibasilar atelectasis. Electronically Signed   By: Kerby Moors M.D.   On: 02/09/2019 08:43   Ct Angio Chest Pe W Or Wo Contrast  Result Date: 02/09/2019 CLINICAL DATA:  Shortness of breath on exertion EXAM: CT ANGIOGRAPHY CHEST WITH CONTRAST TECHNIQUE: Multidetector CT imaging of the chest was performed using the standard protocol during bolus administration of intravenous contrast. Multiplanar CT image reconstructions and MIPs were obtained to evaluate the vascular anatomy. CONTRAST:  22mL OMNIPAQUE IOHEXOL 350 MG/ML SOLN COMPARISON:  Chest x-ray today FINDINGS: Cardiovascular: No filling defects in the pulmonary arteries to suggest pulmonary emboli. Heart is normal size. Aorta is  normal caliber. Mediastinum/Nodes: No mediastinal, hilar, or axillary adenopathy. Trachea and esophagus are unremarkable. Thyroid unremarkable. Small hiatal hernia. Lungs/Pleura: Bibasilar atelectasis.  No effusions. Upper Abdomen: Imaging into the upper abdomen shows no acute findings. Musculoskeletal: Chest wall soft tissues are unremarkable. No acute bony abnormality. Review of the MIP images confirms the above findings. IMPRESSION: No evidence of pulmonary embolus. Bibasilar atelectasis. Electronically Signed   By: Charlett NoseKevin  Dover M.D.   On: 02/09/2019 12:12   Dg Chest Port 1 View  Result Date: 02/08/2019 CLINICAL DATA:  Shortness of breath EXAM: PORTABLE CHEST 1 VIEW COMPARISON:  01/05/2012 FINDINGS: Mildly low lung volumes. Streaky opacity at the bases. Normal heart size. No pneumothorax. IMPRESSION: Low lung volumes with streaky basilar opacities, favored to represent subsegmental atelectasis Electronically Signed   By: Jasmine PangKim  Fujinaga M.D.   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 - she is on multiple centrally acting medications which together do cause serotonin syndrome and EPS although I think drug induced etiology is lower down on differential. IgE to evaluate for chronic urticarial associated reactive airway disease.  -Discussed with patient needing outpatient clinic evaluation for autoimmune pulmonary process including ILD. Will make appt    Thank you for allowing me to participate in the care of this patient.   Patient/Family are satisfied with care plan and all questions have been answered.  This document was prepared using Dragon voice recognition software and may include unintentional dictation errors.     Vida RiggerFuad Alex Leahy, M.D.  Division of Pulmonary & Critical Care Medicine  [facility]

## 2019-02-12 LAB — BASIC METABOLIC PANEL
Anion gap: 7 (ref 5–15)
BUN: 20 mg/dL (ref 6–20)
CO2: 22 mmol/L (ref 22–32)
Calcium: 9 mg/dL (ref 8.9–10.3)
Chloride: 108 mmol/L (ref 98–111)
Creatinine, Ser: 0.88 mg/dL (ref 0.44–1.00)
GFR calc Af Amer: >60 mL/min (ref >60)
GFR calc non Af Amer: >60 mL/min (ref >60)
Glucose, Bld: 165 mg/dL — ABNORMAL HIGH (ref 70–99)
Potassium: 3.5 mmol/L (ref 3.5–5.1)
Sodium: 137 mmol/L (ref 135–145)

## 2019-02-12 LAB — CBC
HCT: 35.1 % — ABNORMAL LOW (ref 36.0–46.0)
Hemoglobin: 11.6 g/dL — ABNORMAL LOW (ref 12.0–15.0)
MCH: 25.8 pg — ABNORMAL LOW (ref 26.0–34.0)
MCHC: 33 g/dL (ref 30.0–36.0)
MCV: 78.2 fL — ABNORMAL LOW (ref 80.0–100.0)
Platelets: 489 10*3/uL — ABNORMAL HIGH (ref 150–400)
RBC: 4.49 MIL/uL (ref 3.87–5.11)
RDW: 15.1 % (ref 11.5–15.5)
WBC: 25.6 10*3/uL — ABNORMAL HIGH (ref 4.0–10.5)
nRBC: 0.1 % (ref 0.0–0.2)

## 2019-02-12 LAB — IGE: IgE (Immunoglobulin E), Serum: 65 IU/mL (ref 6–495)

## 2019-02-12 LAB — FUNGITELL, SERUM: Fungitell Result: <31 pg/mL (ref <80)

## 2019-02-12 MED ORDER — PREDNISONE 50 MG PO TABS
50.0000 mg | ORAL_TABLET | Freq: Every day | ORAL | Status: DC
  Filled 2019-02-12: qty 1

## 2019-02-12 NOTE — Progress Notes (Signed)
Pulmonary Medicine          Date: 02/12/2019,   MRN# [id number] Sarah Warner Jul 06, 1965     AdmissionWeight: 111.6 kg                 CurrentWeight: 113.5 kg      CHIEF COMPLAINT:   Cough and shortness of breath   SUBJECTIVE    Patient states she feels better.  Speaking in full sentences on room air.  Discussed care plan with Internal medicine attending and resident.   Septic workup is thus far unreveiling.   CRP is elevated again pointing at inflammatory etiology.  ANA comp profile negative  Plan to d/c patient in am with additional workup in pulm clinic - discussed in detail with patient .     PAST MEDICAL HISTORY   Past Medical History:  Diagnosis Date  . Depression   . GERD (gastroesophageal reflux disease)   . Hives   . Migraines      SURGICAL HISTORY   Past Surgical History:  Procedure Laterality Date  . CESAREAN SECTION    . KNEE ARTHROSCOPY       FAMILY HISTORY   History reviewed. No pertinent family history.   SOCIAL HISTORY   Social History   Tobacco Use  . Smoking status: Never Smoker  . Smokeless tobacco: Never Used  Substance Use Topics  . Alcohol use: Yes    Comment: social  . Drug use: No     MEDICATIONS    Home Medication:    Current Medication:  Current Facility-Administered Medications:  .  0.9 %  sodium chloride infusion, , Intravenous, PRN, Mayo, Allyn Kenner, MD, Last Rate: 10 mL/hr at 02/10/19 0600 .  albuterol (PROVENTIL) (2.5 MG/3ML) 0.083% nebulizer solution 2.5 mg, 2.5 mg, Nebulization, Once, Lockamy, Timothy, DO .  albuterol (VENTOLIN HFA) 108 (90 Base) MCG/ACT inhaler 1-2 puff, 1-2 puff, Inhalation, Q4H PRN, Mayo, Allyn Kenner, MD, 2 puff at 02/09/19 1314 .  enoxaparin (LOVENOX) injection 40 mg, 40 mg, Subcutaneous, Q12H, Sherryll Burger, Vipul, MD, 40 mg at 02/12/19 1022 .  famotidine (PEPCID) tablet 20 mg, 20 mg, Oral, BID, Mayo, Allyn Kenner, MD, 20 mg at 02/12/19 1023 .  hydroxychloroquine (PLAQUENIL) tablet  400 mg, 400 mg, Oral, QHS, Mayo, Allyn Kenner, MD, 400 mg at 02/11/19 2152 .  ipratropium-albuterol (DUONEB) 0.5-2.5 (3) MG/3ML nebulizer solution 3 mL, 3 mL, Nebulization, Q6H, Mayo, Allyn Kenner, MD, 3 mL at 02/12/19 0933 .  loratadine (CLARITIN) tablet 10 mg, 10 mg, Oral, Daily, Mayo, Allyn Kenner, MD, 10 mg at 02/12/19 1023 .  MEDLINE mouth rinse, 15 mL, Mouth Rinse, BID, Mayo, Allyn Kenner, MD, 15 mL at 02/11/19 2156 .  methylPREDNISolone sodium succinate (SOLU-MEDROL) 125 mg/2 mL injection 60 mg, 60 mg, Intravenous, Q12H, Lockamy, Timothy, DO, 60 mg at 02/12/19 0644 .  mirtazapine (REMERON) tablet 15 mg, 15 mg, Oral, QHS, Mayo, Allyn Kenner, MD, 15 mg at 02/11/19 2152 .  pantoprazole (PROTONIX) EC tablet 20 mg, 20 mg, Oral, Daily, Mayo, Allyn Kenner, MD, 20 mg at 02/12/19 1023 .  [START ON 02/13/2019] predniSONE (DELTASONE) tablet 50 mg, 50 mg, Oral, Q breakfast, Lockamy, Timothy, DO .  sertraline (ZOLOFT) tablet 100 mg, 100 mg, Oral, Daily, Mayo, Allyn Kenner, MD, 100 mg at 02/12/19 1023 .  sodium chloride 0.9 % bolus 1,000 mL, 1,000 mL, Intravenous, Once **AND** sodium chloride 0.9 % bolus 1,000 mL, 1,000 mL, Intravenous, Once **AND** sodium chloride 0.9 % bolus 1,000 mL, 1,000  mL, Intravenous, Once **AND** sodium chloride 0.9 % bolus 500 mL, 500 mL, Intravenous, Once, Manuella Ghazi, Vipul, MD .  sodium chloride flush (NS) 0.9 % injection 3 mL, 3 mL, Intravenous, Q12H, Mayo, Pete Pelt, MD, 3 mL at 02/12/19 1000 .  topiramate (TOPAMAX) tablet 100 mg, 100 mg, Oral, QHS, Mayo, Pete Pelt, MD, 100 mg at 02/11/19 2152 .  venlafaxine XR (EFFEXOR-XR) 24 hr capsule 75 mg, 75 mg, Oral, Daily, Mayo, Pete Pelt, MD, 75 mg at 02/12/19 1023    ALLERGIES   Nsaids and Erythromycin     REVIEW OF SYSTEMS    Review of Systems:  Gen:  Denies  fever, sweats, chills weigh loss  HEENT: Denies blurred vision, double vision, ear pain, eye pain, hearing loss, nose bleeds, sore throat Cardiac:  No dizziness, chest pain or heaviness,  chest tightness,edema Resp:   Denies cough or sputum porduction, shortness of breath,wheezing, hemoptysis,  Gi: Denies swallowing difficulty, stomach pain, nausea or vomiting, diarrhea, constipation, bowel incontinence Gu:  Denies bladder incontinence, burning urine Ext:   Denies Joint pain, stiffness or swelling Skin: Denies  skin rash, easy bruising or bleeding or hives Endoc:  Denies polyuria, polydipsia , polyphagia or weight change Psych:   Denies depression, insomnia or hallucinations   Other:  All other systems negative   VS: BP (!) 158/89 (BP Location: Left Arm)   Pulse 82   Temp 98 F (36.7 C) (Oral)   Resp 18   Ht 5' 5.5" (1.664 m)   Wt 113.5 kg   LMP  (LMP Unknown)   SpO2 94%   BMI 41.02 kg/m      PHYSICAL EXAM    GENERAL:NAD, no fevers, chills, no weakness no fatigue HEAD: Normocephalic, atraumatic.  EYES: Pupils equal, round, reactive to light. Extraocular muscles intact. No scleral icterus.  MOUTH: Moist mucosal membrane. Dentition intact. No abscess noted.  EAR, NOSE, THROAT: Clear without exudates. No external lesions.  NECK: Supple. No thyromegaly. No nodules. No JVD.  PULMONARY: decreased air entry bilaterally , monophonic wheezing at bases bilaterally CARDIOVASCULAR: S1 and S2. Regular rate and rhythm. No murmurs, rubs, or gallops. No edema. Pedal pulses 2+ bilaterally.  GASTROINTESTINAL: Soft, nontender, nondistended. No masses. Positive bowel sounds. No hepatosplenomegaly.  MUSCULOSKELETAL: No swelling, clubbing, or edema. Range of motion full in all extremities.  NEUROLOGIC: Cranial nerves II through XII are intact. No gross focal neurological deficits. Sensation intact. Reflexes intact.  SKIN: No ulceration, lesions, rashes, or cyanosis. Skin warm and dry. Turgor intact.  PSYCHIATRIC: Mood, affect within normal limits. The patient is awake, alert and oriented x 3. Insight, judgment intact.       IMAGING    Dg Chest 2 View  Result Date:  02/09/2019 CLINICAL DATA:  Shortness of breath and cough. EXAM: CHEST - 2 VIEW COMPARISON:  02/08/2019 FINDINGS: The heart size appears normal. No pleural effusion or edema. Decreased lung volumes with persistent bibasilar atelectasis. The visualized osseous structures appear intact. IMPRESSION: 1. Unchanged low lung volumes with bibasilar atelectasis. Electronically Signed   By: Kerby Moors M.D.   On: 02/09/2019 08:43   Ct Angio Chest Pe W Or Wo Contrast  Result Date: 02/09/2019 CLINICAL DATA:  Shortness of breath on exertion EXAM: CT ANGIOGRAPHY CHEST WITH CONTRAST TECHNIQUE: Multidetector CT imaging of the chest was performed using the standard protocol during bolus administration of intravenous contrast. Multiplanar CT image reconstructions and MIPs were obtained to evaluate the vascular anatomy. CONTRAST:  4mL OMNIPAQUE IOHEXOL 350 MG/ML SOLN COMPARISON:  Chest x-ray today FINDINGS: Cardiovascular: No filling defects in the pulmonary arteries to suggest pulmonary emboli. Heart is normal size. Aorta is normal caliber. Mediastinum/Nodes: No mediastinal, hilar, or axillary adenopathy. Trachea and esophagus are unremarkable. Thyroid unremarkable. Small hiatal hernia. Lungs/Pleura: Bibasilar atelectasis.  No effusions. Upper Abdomen: Imaging into the upper abdomen shows no acute findings. Musculoskeletal: Chest wall soft tissues are unremarkable. No acute bony abnormality. Review of the MIP images confirms the above findings. IMPRESSION: No evidence of pulmonary embolus. Bibasilar atelectasis. Electronically Signed   By: Charlett NoseKevin  Dover M.D.   On: 02/09/2019 12:12   Dg Chest Port 1 View  Result Date: 02/08/2019 CLINICAL DATA:  Shortness of breath EXAM: PORTABLE CHEST 1 VIEW COMPARISON:  01/05/2012 FINDINGS: Mildly low lung volumes. Streaky opacity at the bases. Normal heart size. No pneumothorax. IMPRESSION: Low lung volumes with streaky basilar opacities, favored to represent subsegmental atelectasis  Electronically Signed   By: Jasmine PangKim  Fujinaga M.D.   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 - she is on multiple centrally acting medications which together do cause serotonin syndrome and EPS although I think drug induced etiology is lower down on differential. IgE to evaluate for chronic urticarial associated reactive airway disease.  -Discussed with patient needing outpatient clinic evaluation for autoimmune pulmonary process including ILD. Will make appt    Thank you for allowing me to participate in the care of this patient.   Patient/Family are satisfied with care plan and all questions have been answered.  This document was prepared using Dragon voice recognition software and may include unintentional dictation errors.     Vida RiggerFuad Jaxon Mynhier, M.D.  Division of Pulmonary & Critical Care Medicine  [facility]

## 2019-02-12 NOTE — Progress Notes (Signed)
[name] at [place] NAME: Sarah Warner    MR#:  [id number]  DATE OF BIRTH:  Jan 02, 1966  SUBJECTIVE:   Patient states she feels better this morning. She is not in pain and her breathing has improved. She appears to be able to talk more easily without becoming winded. She is on 1L of O2NC and has no chest pain. [name] has a dry cough.  REVIEW OF SYSTEMS:  Review of Systems  Constitutional: Negative for chills and fever.  HENT: Negative for congestion and sore throat.   Eyes: Negative for blurred vision and double vision.  Respiratory: Positive for cough and shortness of breath.   Cardiovascular: Negative for chest pain, palpitations and leg swelling.  Gastrointestinal: Negative for nausea and vomiting.  Genitourinary: Negative for dysuria and urgency.  Musculoskeletal: Negative for back pain and neck pain.  Neurological: Negative for dizziness and headaches.  Psychiatric/Behavioral: Negative for depression. The patient is not nervous/anxious.    DRUG ALLERGIES:   Allergies  Allergen Reactions  . Nsaids Swelling  . Erythromycin Rash   VITALS:  Blood pressure (!) 149/88, pulse 81, temperature 98.4 F (36.9 C), temperature source Oral, resp. rate 18, height 5' 5.5" (1.664 m), weight 113.5 kg, SpO2 100 %. PHYSICAL EXAMINATION:  Physical Exam  Gen: Alert and Oriented x 3, NAD CV: RRR, no murmurs, normal S1, S2 split Resp: CTAB, no wheezing, rales, or rhonchi, comfortable work of breathing Abd: non-distended, non-tender, soft, +bs in all four quadrants Ext: no clubbing, cyanosis, or edema Skin: warm, dry, intact, no rashes  LABORATORY PANEL:  Female CBC Recent Labs  Lab 02/12/19 0513  WBC 25.6*  HGB 11.6*  HCT 35.1*  PLT 489*   ------------------------------------------------------------------------------------------------------------------ Chemistries  Recent Labs  Lab 02/08/19 2055  02/12/19 0513  NA 136   < > 137  K  3.4*   < > 3.5  CL 107   < > 108  CO2 18*   < > 22  GLUCOSE 118*   < > 165*  BUN 15   < > 20  CREATININE 1.07*   < > 0.88  CALCIUM 9.6   < > 9.0  AST 23  --   --   ALT 12  --   --   ALKPHOS 64  --   --   BILITOT 0.7  --   --    < > = values in this interval not displayed.   RADIOLOGY:  No results found. ASSESSMENT AND PLAN:   Acute hypoxic respiratory failure - still unclear etiology. She will undergo outpatient work up with Pulmonlogy for interstitial lung disease. However, CT of her chest showed no findings consistent with this. Looks less like an active infection but will rule out PJP PNA. Leukocytosis could be from steroids. ANA negative. Echo was unremarkable; BNP normal. CTA chest negative for PE. Troponin negative. PFTs were not able to be completed due to patient WOB.  - Wean O2 as able today - Ambulate with pulse ox today - Pulmonology consulted; PJP PNA considered vs reactive airway disease from chronic urticariaa. Labs still pending.  - Discont IV Solumedrol today, got her dose this am so will start Prednisone tomorrow. - Cont duonebs every 6 hours - Cont Incentive spirometry  Depression - stable, no active symptoms. -Continue home meds: sertraline 100mg , remeron 15mg  and effexor 75mg  daily.  Microcytic anemia. Folate is low at 5.2, Vit B12 normal at 538, Ferritin normal at 149, Iron and  TIBC unremarkable. - daily CBC - Consider restarting oral iron 325mg  daily on discharge as Ferritin could be falsely elevated due to inflammatory process  Thrombocytosis- chronic. Smear consistent with acute infectious process superimposed on chronic iron deficiency anemia. - peripheral smear suggestive of active infection/inflammation superimposed on chronic iron deficiency  All the records are reviewed and case discussed with Care Management/Social Worker. Management plans discussed with the patient, family and they are in agreement.  CODE STATUS: Full Code  TOTAL TIME TAKING CARE  OF THIS PATIENT: 45 minutes.   More than 50% of the time was spent in counseling/coordination of care: YES  POSSIBLE D/C IN 1 DAY, DEPENDING ON CLINICAL CONDITION.   Arlyce Harmanimothy Omesha Bowerman M.D on 02/12/2019 at 8:21 AM  Between 7am to 6pm - Pager - [phone]  After 6pm go to [email or web address] - password [email or web address]  [employer] [place] [facility]  Office  [phone]  CC: Primary care physician; Marlan PalauGard, Allison  Note: This dictation was prepared with Dragon dictation along with smaller phrase technology. Any transcriptional errors that result from this process are unintentional.

## 2019-02-12 NOTE — Plan of Care (Signed)
  Problem: Clinical Measurements: Goal: Respiratory complications will improve Outcome: Progressing   Problem: Coping: Goal: Level of anxiety will decrease Outcome: Progressing   Problem: Safety: Goal: Ability to remain free from injury will improve Outcome: Progressing   Problem: Activity: Goal: Risk for activity intolerance will decrease Outcome: Completed/Met

## 2019-02-13 LAB — BASIC METABOLIC PANEL
Anion gap: 9 (ref 5–15)
BUN: 18 mg/dL (ref 6–20)
CO2: 22 mmol/L (ref 22–32)
Calcium: 9.1 mg/dL (ref 8.9–10.3)
Chloride: 107 mmol/L (ref 98–111)
Creatinine, Ser: 0.8 mg/dL (ref 0.44–1.00)
GFR calc Af Amer: >60 mL/min (ref >60)
GFR calc non Af Amer: >60 mL/min (ref >60)
Glucose, Bld: 125 mg/dL — ABNORMAL HIGH (ref 70–99)
Potassium: 3.5 mmol/L (ref 3.5–5.1)
Sodium: 138 mmol/L (ref 135–145)

## 2019-02-13 LAB — CBC
HCT: 38.1 % (ref 36.0–46.0)
Hemoglobin: 12.6 g/dL (ref 12.0–15.0)
MCH: 25.8 pg — ABNORMAL LOW (ref 26.0–34.0)
MCHC: 33.1 g/dL (ref 30.0–36.0)
MCV: 77.9 fL — ABNORMAL LOW (ref 80.0–100.0)
Platelets: 537 10*3/uL — ABNORMAL HIGH (ref 150–400)
RBC: 4.89 MIL/uL (ref 3.87–5.11)
RDW: 14.8 % (ref 11.5–15.5)
WBC: 28 10*3/uL — ABNORMAL HIGH (ref 4.0–10.5)
nRBC: 0.8 % — ABNORMAL HIGH (ref 0.0–0.2)

## 2019-02-13 LAB — CULTURE, BLOOD (ROUTINE X 2)
Culture: NO GROWTH
Culture: NO GROWTH
Special Requests: ADEQUATE
Special Requests: ADEQUATE

## 2019-02-13 NOTE — Plan of Care (Signed)
  Problem: Clinical Measurements: Goal: Respiratory complications will improve Outcome: Progressing Note: Now on room air   Problem: Coping: Goal: Level of anxiety will decrease Outcome: Progressing   Problem: Safety: Goal: Ability to remain free from injury will improve Outcome: Progressing   Problem: Education: Goal: Knowledge of General Education information will improve Description: Including pain rating scale, medication(s)/side effects and non-pharmacologic comfort measures Outcome: Completed/Met

## 2019-02-13 NOTE — Discharge Summary (Signed)
[facility] at [place] NAME: Sarah Warner    MR#:  [id number]  DATE OF BIRTH:  Nov 27, 1965  DATE OF ADMISSION:  02/08/2019   ADMITTING PHYSICIAN: Max Sane, MD  DATE OF DISCHARGE: No discharge date for patient encounter.  PRIMARY CARE PHYSICIAN: Dartha Lodge   ADMISSION DIAGNOSIS:  Sepsis, due to unspecified organism, unspecified whether acute organ dysfunction present ([place]) [A41.9] DISCHARGE DIAGNOSIS:  Active Problems:   Sepsis ([place]) Acute Hypoxic Respiratory failure  SECONDARY DIAGNOSIS:   Past Medical History:  Diagnosis Date  . Depression   . GERD (gastroesophageal reflux disease)   . Hives   . Migraines    HOSPITAL COURSE:   [name] is a 53y/o female with PMH of HTN, Uticaria, GERD, Migraines, and depression who presented with acute hypoxic respiratory failure requiring supplemental oxygen and meeting criteria for Sepsis. She was started on broad spectrum antibiotics and CXR showed lung volumes with some bibasilar atelectasis due to concern for PNA. She did present with an elevated WBC of 24.0. She also had a chest CTA performed due to concern for PE and CT showed no evidence of a PE. Her blood and urine cultures were negative and RVP was negative. Her antibiotics were stopped and she was treated with IV steroids and duonbes to which she responded well. Her Echocardiogram was normal. Over the course of a few days her respiratory status improved and her SOB improved and she was weaned off supplemental oxygen. On day of discharge she was able to ambulate without the need for oxygen. On day of discharge she was given 50mg  of Prednisone to complete a full 5 day course of steroids. She was seen and evaluated by Pulmonology who obtained some labs while she was hospitalized and recommended outpatient follow up for continued work up with concern for interstitial lung disease.  On day of discharge Sarah Warner was feeling well with normal  vitals including O2 saturation of greater than 98% on room air and not short of breath at rest. She has some continued shortness of breath on exertion.  DISCHARGE CONDITIONS:  Stable CONSULTS OBTAINED:  Treatment Team:  Ottie Glazier, MD DRUG ALLERGIES:   Allergies  Allergen Reactions  . Nsaids Swelling  . Erythromycin Rash   DISCHARGE MEDICATIONS:   Allergies as of 02/13/2019      Reactions   Nsaids Swelling   Erythromycin Rash      Medication List    STOP taking these medications   doxycycline 100 MG tablet Commonly known as: VIBRA-TABS   pantoprazole 20 MG tablet Commonly known as: PROTONIX     TAKE these medications   albuterol 108 (90 Base) MCG/ACT inhaler Commonly known as: VENTOLIN HFA Inhale 1-2 puffs into the lungs every 4 (four) hours as needed.   BOTOX IJ Inject as directed.   famotidine 20 MG tablet Commonly known as: PEPCID Take 20 mg by mouth 2 (two) times daily.   fexofenadine 60 MG tablet Commonly known as: ALLEGRA Take 60 mg by mouth 2 (two) times daily.   hydroxychloroquine 200 MG tablet Commonly known as: PLAQUENIL Take 400 mg by mouth at bedtime.   hydrOXYzine 25 MG capsule Commonly known as: VISTARIL Take 25 mg by mouth 3 (three) times daily as needed.   mirtazapine 15 MG tablet Commonly known as: REMERON Take 15 mg by mouth at bedtime.   olmesartan 40 MG tablet Commonly known as: BENICAR Take 40 mg by mouth daily.  ranitidine 150 MG tablet Commonly known as: ZANTAC Take 150 mg by mouth 2 (two) times daily.   sertraline 50 MG tablet Commonly known as: ZOLOFT Take 100 mg by mouth daily.   spironolactone 25 MG tablet Commonly known as: ALDACTONE Take 0.5 tablets by mouth daily.   SUMAtriptan 100 MG tablet Commonly known as: IMITREX sumatriptan 100 mg tablet  TAKE 1 TABLET BY MOUTH ONCE AS NEEDED FOR UP TO 1 DOSE. MAY TAKE 2ND DOSE AFTER 2 HOURS IF NEEDED   topiramate 100 MG tablet Commonly known as: TOPAMAX Take  100 mg by mouth at bedtime.   venlafaxine XR 75 MG 24 hr capsule Commonly known as: EFFEXOR-XR Take 1 capsule by mouth daily.        DISCHARGE INSTRUCTIONS:  Please follow up with Pulmonology in their outpatient clinic for further work up for possible interstitial lung disease  Please restart taking your blood pressure medications  DIET:  Regular diet DISCHARGE CONDITION:  Stable ACTIVITY:  Activity as tolerated OXYGEN:  Home Oxygen: No.  Oxygen Delivery: room air DISCHARGE LOCATION:  home   If you experience worsening of your admission symptoms, develop shortness of breath, life threatening emergency, suicidal or homicidal thoughts you must seek medical attention immediately by calling 911 or calling your MD immediately  if symptoms less severe.  You Must read complete instructions/literature along with all the possible adverse reactions/side effects for all the Medicines you take and that have been prescribed to you. Take any new Medicines after you have completely understood and accpet all the possible adverse reactions/side effects.   Please note  You were cared for by a hospitalist during your hospital stay. If you have any questions about your discharge medications or the care you received while you were in the hospital after you are discharged, you can call the unit and asked to speak with the hospitalist on call if the hospitalist that took care of you is not available. Once you are discharged, your primary care physician will handle any further medical issues. Please note that NO REFILLS for any discharge medications will be authorized once you are discharged, as it is imperative that you return to your primary care physician (or establish a relationship with a primary care physician if you do not have one) for your aftercare needs so that they can reassess your need for medications and monitor your lab values.  On the day of Discharge:  VITAL SIGNS:  Blood pressure (!)  151/84, pulse 70, temperature 97.9 F (36.6 C), temperature source Oral, resp. rate 19, height 5' 5.5" (1.664 m), weight 113.5 kg, SpO2 97 %. PHYSICAL EXAMINATION:  GENERAL:  53 y.o.-year-old patient lying in the bed with no acute distress.  Gen: Alert and Oriented x 3, NAD CV: RRR, no murmurs, normal S1, S2 split Resp: decreased air flow diffusely, no wheezing, no crackles Abd: non-distended, non-tender, soft, +bs in all four quadrants Ext: no clubbing, cyanosis, or edema Skin: warm, dry, intact, no rashes DATA REVIEW:   CBC Recent Labs  Lab 02/13/19 0703  WBC 28.0*  HGB 12.6  HCT 38.1  PLT 537*    Chemistries  Recent Labs  Lab 02/08/19 2055  02/12/19 0513  NA 136   < > 137  K 3.4*   < > 3.5  CL 107   < > 108  CO2 18*   < > 22  GLUCOSE 118*   < > 165*  BUN 15   < > 20  CREATININE 1.07*   < >  0.88  CALCIUM 9.6   < > 9.0  AST 23  --   --   ALT 12  --   --   ALKPHOS 64  --   --   BILITOT 0.7  --   --    < > = values in this interval not displayed.     Microbiology Results  Results for orders placed or performed during the hospital encounter of 02/08/19  Blood Culture (routine x 2)     Status: None   Collection Time: 02/08/19  8:55 PM   Specimen: BLOOD  Result Value Ref Range Status   Specimen Description BLOOD BLOOD LEFT HAND  Final   Special Requests   Final    BOTTLES DRAWN AEROBIC AND ANAEROBIC Blood Culture adequate volume   Culture   Final    NO GROWTH 5 DAYS Performed at [facility], [address] [place], [place] [address]    Report Status 02/13/2019 FINAL  Final  Blood Culture (routine x 2)     Status: None   Collection Time: 02/08/19  8:55 PM   Specimen: BLOOD  Result Value Ref Range Status   Specimen Description BLOOD LEFT ANTECUBITAL  Final   Special Requests   Final    BOTTLES DRAWN AEROBIC AND ANAEROBIC Blood Culture adequate volume   Culture   Final    NO GROWTH 5 DAYS Performed at [facility], [address]  [place], [place] [address]    Report Status 02/13/2019 FINAL  Final  SARS Coronavirus 2 [facility](Hospital order, Performed in [facility] lab) Nasopharyngeal Nasopharyngeal Swab     Status: None   Collection Time: 02/08/19  8:55 PM   Specimen: Nasopharyngeal Swab  Result Value Ref Range Status   SARS Coronavirus 2 NEGATIVE NEGATIVE Final    Comment: (NOTE) If result is NEGATIVE SARS-CoV-2 target nucleic acids are NOT DETECTED. The SARS-CoV-2 RNA is generally detectable in upper and lower  respiratory specimens during the acute phase of infection. The lowest  concentration of SARS-CoV-2 viral copies this assay can detect is 250  copies / mL. A negative result does not preclude SARS-CoV-2 infection  and should not be used as the sole basis for treatment or other  patient management decisions.  A negative result may occur with  improper specimen collection / handling, submission of specimen other  than nasopharyngeal swab, presence of viral mutation(s) within the  areas targeted by this assay, and inadequate number of viral copies  (<250 copies / mL). A negative result must be combined with clinical  observations, patient history, and epidemiological information. If result is POSITIVE SARS-CoV-2 target nucleic acids are DETECTED. The SARS-CoV-2 RNA is generally detectable in upper and lower  respiratory specimens dur ing the acute phase of infection.  Positive  results are indicative of active infection with SARS-CoV-2.  Clinical  correlation with patient history and other diagnostic information is  necessary to determine patient infection status.  Positive results do  not rule out bacterial infection or co-infection with other viruses. If result is PRESUMPTIVE POSTIVE SARS-CoV-2 nucleic acids MAY BE PRESENT.   A presumptive positive result was obtained on the submitted specimen  and confirmed on repeat testing.  While 2019 novel coronavirus  (SARS-CoV-2) nucleic acids may be present in  the submitted sample  additional confirmatory testing may be necessary for epidemiological  and / or clinical management purposes  to differentiate between  SARS-CoV-2 and other Sarbecovirus currently known to infect humans.  If  clinically indicated additional testing with an alternate test  methodology [phone]) is advised. The SARS-CoV-2 RNA is generally  detectable in upper and lower respiratory sp ecimens during the acute  phase of infection. The expected result is Negative. Fact Sheet for Patients:  BoilerBrush.com.cy Fact Sheet for Healthcare Providers: https://pope.com/ This test is not yet approved or cleared by the [place] FDA and has been authorized for detection and/or diagnosis of SARS-CoV-2 by FDA under an Emergency Use Authorization (EUA).  This EUA will remain in effect (meaning this test can be used) for the duration of the COVID-19 declaration under Section 564(b)(1) of the Act, 21 U.S.C. section 360bbb-3(b)(1), unless the authorization is terminated or revoked sooner. Performed at [facility], [address] [place], [place] [address]   Urine culture     Status: Abnormal   Collection Time: 02/08/19 11:45 PM   Specimen: Urine, Random  Result Value Ref Range Status   Specimen Description   Final    URINE, RANDOM Performed at [facility], [address], [place], [place] [address]    Special Requests   Final    NONE Performed at [facility], [address] [place], [place] [address]    Culture (A)  Final    <10,000 COLONIES/mL INSIGNIFICANT GROWTH Performed at [facility], [address] [place], [place] [address]    Report Status 02/10/2019 FINAL  Final  Respiratory Panel by PCR     Status: None   Collection Time: 02/09/19 10:35 AM   Specimen: Nasopharyngeal Swab; Respiratory  Result Value Ref Range Status   Adenovirus NOT DETECTED NOT DETECTED Final    Coronavirus 229E NOT DETECTED NOT DETECTED Final    Comment: (NOTE) The Coronavirus on the Respiratory Panel, DOES NOT test for the novel  Coronavirus (2019 nCoV)    Coronavirus HKU1 NOT DETECTED NOT DETECTED Final   Coronavirus NL63 NOT DETECTED NOT DETECTED Final   Coronavirus OC43 NOT DETECTED NOT DETECTED Final   Metapneumovirus NOT DETECTED NOT DETECTED Final   Rhinovirus / Enterovirus NOT DETECTED NOT DETECTED Final   Influenza A NOT DETECTED NOT DETECTED Final   Influenza B NOT DETECTED NOT DETECTED Final   Parainfluenza Virus 1 NOT DETECTED NOT DETECTED Final   Parainfluenza Virus 2 NOT DETECTED NOT DETECTED Final   Parainfluenza Virus 3 NOT DETECTED NOT DETECTED Final   Parainfluenza Virus 4 NOT DETECTED NOT DETECTED Final   Respiratory Syncytial Virus NOT DETECTED NOT DETECTED Final   Bordetella pertussis NOT DETECTED NOT DETECTED Final   Chlamydophila pneumoniae NOT DETECTED NOT DETECTED Final   Mycoplasma pneumoniae NOT DETECTED NOT DETECTED Final    Comment: Performed at [facility], [address], [place], [place] [address]    RADIOLOGY:  No results found. CXR IMPRESSION: 1. Unchanged low lung volumes with bibasilar atelectasis.  Chest CTA IMPRESSION: No evidence of pulmonary embolus.  Echocardiogram IMPRESSIONS  1. The left ventricle has hyperdynamic systolic function, with an ejection fraction of >65%. The cavity size was normal. Left ventricular diastolic parameters were normal. No evidence of left ventricular regional wall motion abnormalities.  2. The right ventricle has normal systolic function. The cavity was normal. There is no increase in right ventricular wall thickness.  3. The aorta is normal unless otherwise noted.  4. The interatrial septum was not assessed.  Management plans discussed with the patient, family and they are in agreement.  CODE STATUS: Full Code   TOTAL TIME TAKING CARE OF  THIS PATIENT: 45 minutes.    Arlyce Harman  M.D on 02/13/2019 at 9:53 AM  Between 7am to 6pm - Pager - [phone]  After 6pm go to [email or web address] - password [email or web address] [facility]  [employer] [place] [facility]  Office  [phone]  CC: Primary care physician; Marlan Palau   Note: This dictation was prepared with Dragon dictation along with smaller phrase technology. Any transcriptional errors that result from this process are unintentional.

## 2019-02-13 NOTE — Discharge Instructions (Signed)
Acute Respiratory Failure, Adult ° °Acute respiratory failure occurs when there is not enough oxygen passing from your lungs to your body. When this happens, your lungs have trouble removing carbon dioxide from the blood. This causes your blood oxygen level to drop too low as carbon dioxide builds up. °Acute respiratory failure is a medical emergency. It can develop quickly, but it is temporary if treated promptly. Your lung capacity, or how much air your lungs can hold, may improve with time, exercise, and treatment. °What are the causes? °There are many possible causes of acute respiratory failure, including: °· Lung injury. °· Chest injury or damage to the ribs or tissues near the lungs. °· Lung conditions that affect the flow of air and blood into and out of the lungs, such as pneumonia, acute respiratory distress syndrome, and cystic fibrosis. °· Medical conditions, such as strokes or spinal cord injuries, that affect the muscles and nerves that control breathing. °· Blood infection (sepsis). °· Inflammation of the pancreas (pancreatitis). °· A blood clot in the lungs (pulmonary embolism). °· A large-volume blood transfusion. °· Burns. °· Near-drowning. °· Seizure. °· Smoke inhalation. °· Reaction to medicines. °· Alcohol or drug overdose. °What increases the risk? °This condition is more likely to develop in people who have: °· A blocked airway. °· Asthma. °· A condition or disease that damages or weakens the muscles, nerves, bones, or tissues that are involved in breathing. °· A serious infection. °· A health problem that blocks the unconscious reflex that is involved in breathing, such as hypothyroidism or sleep apnea. °· A lung injury or trauma. °What are the signs or symptoms? °Trouble breathing is the main symptom of acute respiratory failure. Symptoms may also include: °· Rapid breathing. °· Restlessness or anxiety. °· Skin, lips, or fingernails that appear blue (cyanosis). °· Rapid heart  rate. °· Abnormal heart rhythms (arrhythmias). °· Confusion or changes in behavior. °· Tiredness or loss of energy. °· Feeling sleepy or having a loss of consciousness. °How is this diagnosed? °Your health care provider can diagnose acute respiratory failure with a medical history and physical exam. During the exam, your health care provider will listen to your heart and check for crackling or wheezing sounds in your lungs. Your may also have tests to confirm the diagnosis and determine what is causing respiratory failure. These tests may include: °· Measuring the amount of oxygen in your blood (pulse oximetry). The measurement comes from a small device that is placed on your finger, earlobe, or toe. °· Other blood tests to measure blood gases and to look for signs of infection. °· Sampling your cerebral spinal fluid or tracheal fluid to check for infections. °· Chest X-ray to look for fluid in spaces that should be filled with air. °· Electrocardiogram (ECG) to look at the heart's electrical activity. °How is this treated? °Treatment for this condition usually takes places in a hospital intensive care unit (ICU). Treatment depends on what is causing the condition. It may include one or more treatments until your symptoms improve. Treatment may include: °· Supplemental oxygen. Extra oxygen is given through a tube in the nose, a face mask, or a hood. °· A device such as a continuous positive airway pressure (CPAP) or bi-level positive airway pressure (BiPAP or BPAP) machine. This treatment uses mild air pressure to keep the airways open. A mask or other device will be placed over your nose or mouth. A tube that is connected to a motor will deliver oxygen through the   mask. °· Ventilator. This treatment helps move air into and out of the lungs. This may be done with a bag and mask or a machine. For this treatment, a tube is placed in your windpipe (trachea) so air and oxygen can flow to the lungs. °· Extracorporeal  membrane oxygenation (ECMO). This treatment temporarily takes over the function of the heart and lungs, supplying oxygen and removing carbon dioxide. ECMO gives the lungs a chance to recover. It may be used if a ventilator is not effective. °· Tracheostomy. This is a procedure that creates a hole in the neck to insert a breathing tube. °· Receiving fluids and medicines. °· Rocking the bed to help breathing. °Follow these instructions at home: °· Take over-the-counter and prescription medicines only as told by your health care provider. °· Return to normal activities as told by your health care provider. Ask your health care provider what activities are safe for you. °· Keep all follow-up visits as told by your health care provider. This is important. °How is this prevented? °Treating infections and medical conditions that may lead to acute respiratory failure can help prevent the condition from developing. °Contact a health care provider if: °· You have a fever. °· Your symptoms do not improve or they get worse. °Get help right away if: °· You are having trouble breathing. °· You lose consciousness. °· Your have cyanosis or turn blue. °· You develop a rapid heart rate. °· You are confused. °These symptoms may represent a serious problem that is an emergency. Do not wait to see if the symptoms will go away. Get medical help right away. Call your local emergency services (911 in the [place] Do not drive yourself to the hospital. °This information is not intended to replace advice given to you by your health care provider. Make sure you discuss any questions you have with your health care provider. °Document Released: 05/19/2013 Document Revised: 04/26/2017 Document Reviewed: 11/30/2015 °Elsevier Patient Education © 2020 [employer] ° °

## 2019-02-13 NOTE — Progress Notes (Signed)
Patient alert and oriented, vss, no complaints of pain.  D/c home with husband.  Escorted out of hospital via wheelchair by volunteers.

## 2019-02-13 NOTE — Progress Notes (Signed)
Patient ambulated around nursing station with no assistance and o2 saturations stayed greater than 96% the entire time.  Patient does not qualify for oxygen.

## 2019-10-24 IMAGING — CT CT ANGIO CHEST
2 of 6 series · 19 of 36 positions shown · IV contrast (APPLIED)
Comparison: Chest x-ray today

CLINICAL DATA: Shortness of breath on exertion

EXAM:
CT ANGIOGRAPHY CHEST WITH CONTRAST
TECHNIQUE: Multidetector CT imaging of the chest was performed using the
standard protocol during bolus administration of intravenous
contrast. Multiplanar CT image reconstructions and MIPs were
obtained to evaluate the vascular anatomy.
CONTRAST:  75mL OMNIPAQUE IOHEXOL 350 MG/ML SOLN

[Series 5: thins · axial · 0.73mm/px · z∈[-250,-39]mm · 18 of 235 slices shown]
[im 12/235  lung]
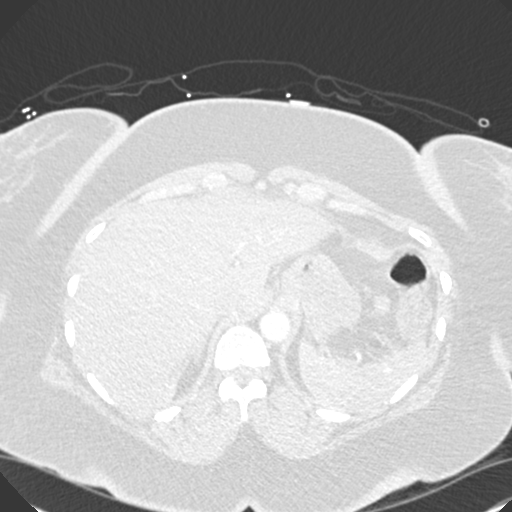
[im 24/235  mediastinal]
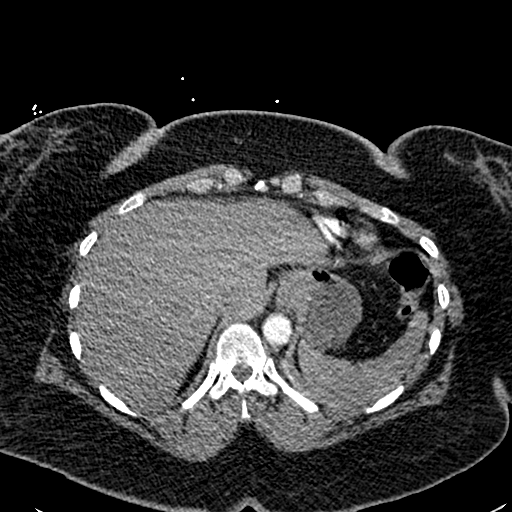
[im 36/235  lung]
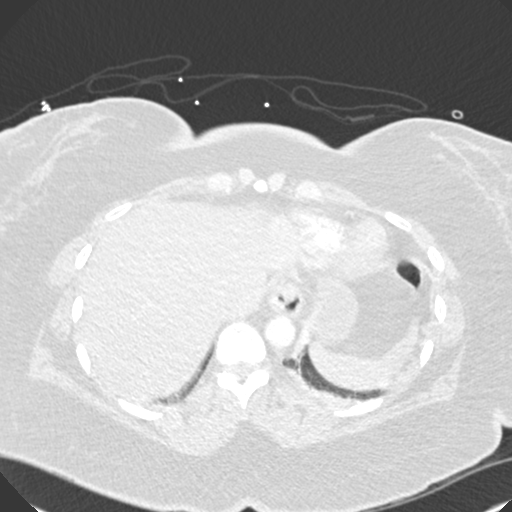
[im 47/235  mediastinal]
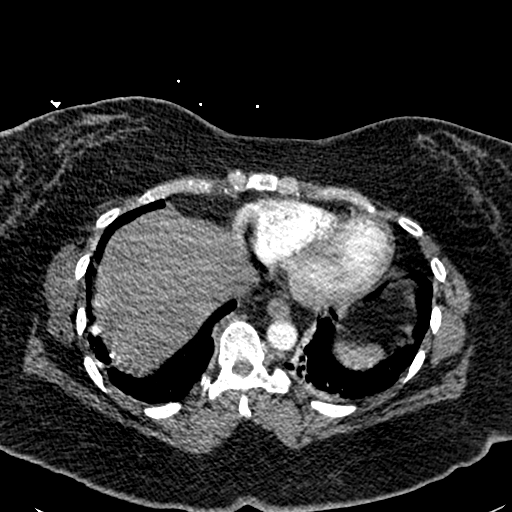
[im 59/235  lung]
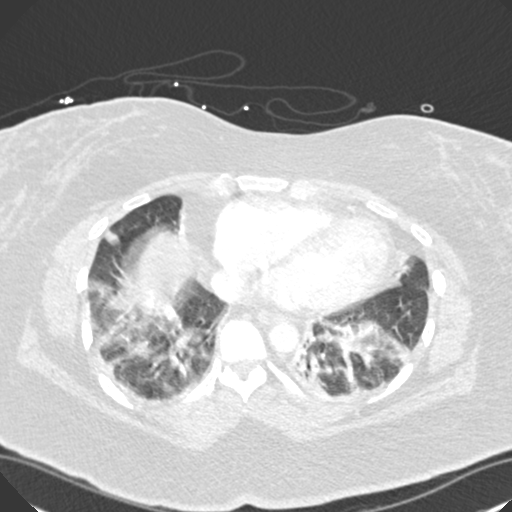
[im 71/235  mediastinal]
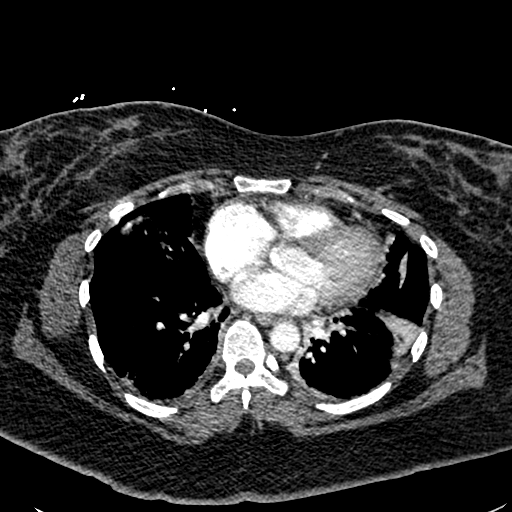
[im 82/235  lung]
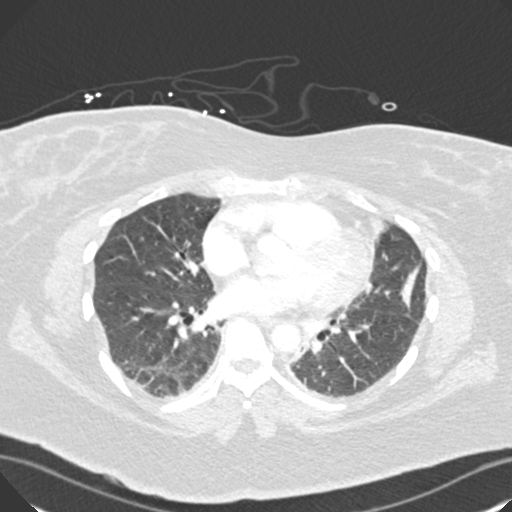
[im 94/235  mediastinal]
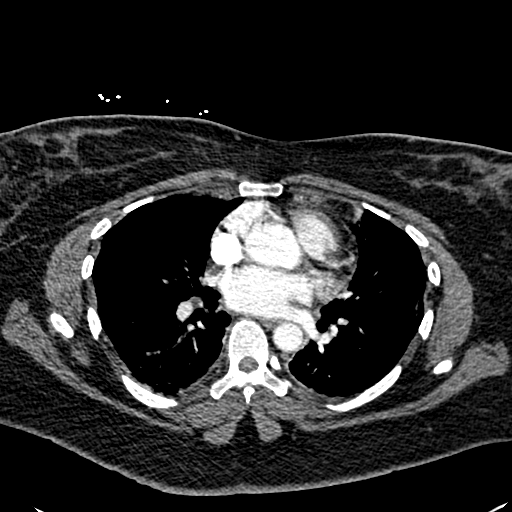
[im 106/235  lung]
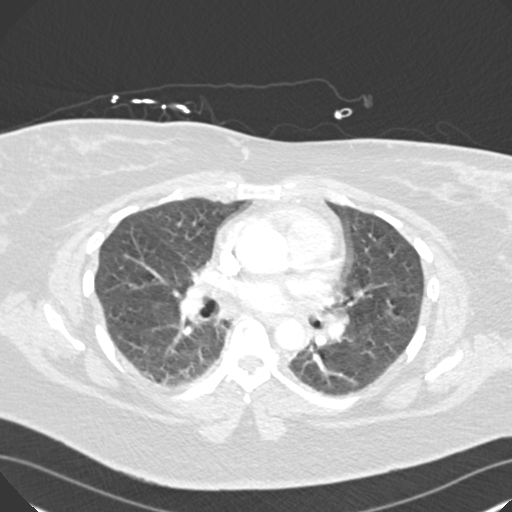
[im 129/235  mediastinal]
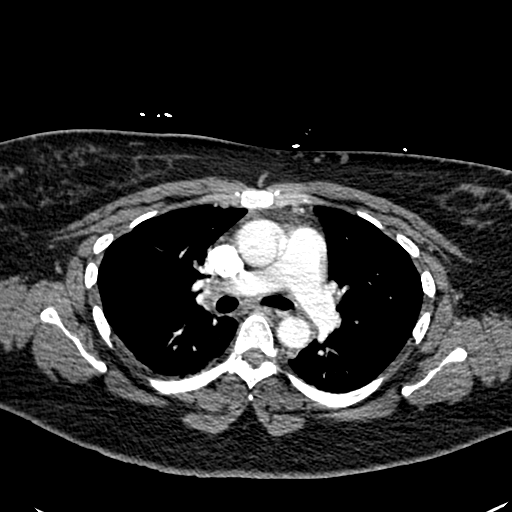
[im 141/235  lung]
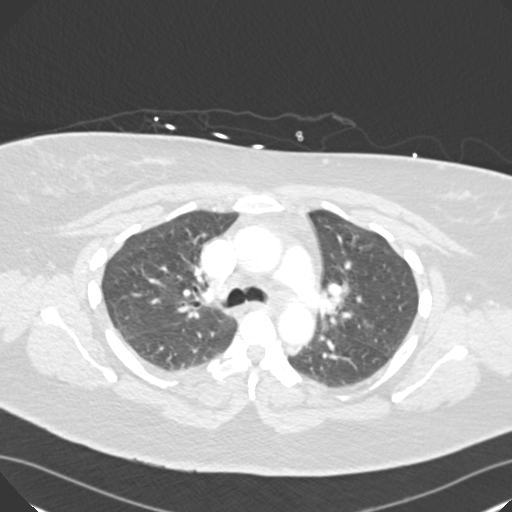
[im 153/235  mediastinal]
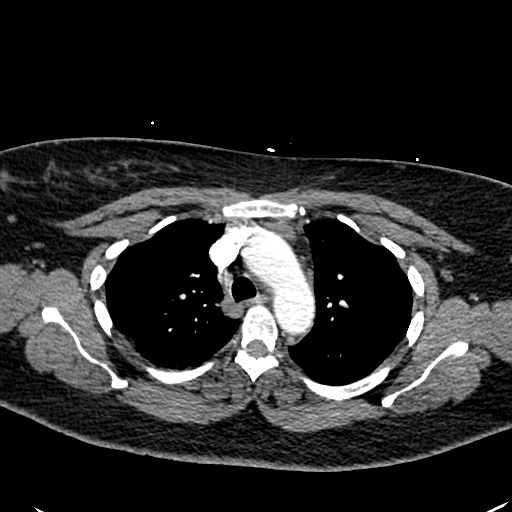
[im 164/235  lung]
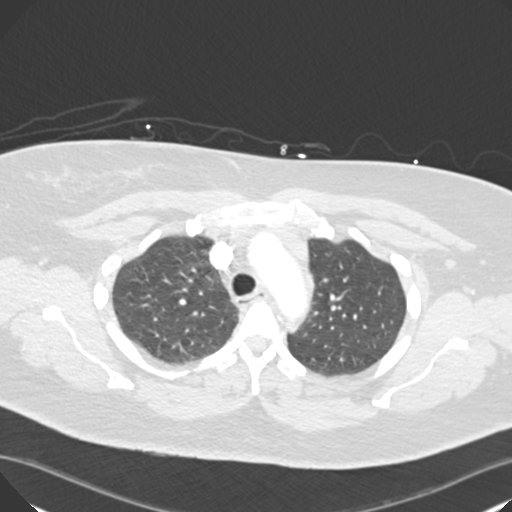
[im 176/235  mediastinal]
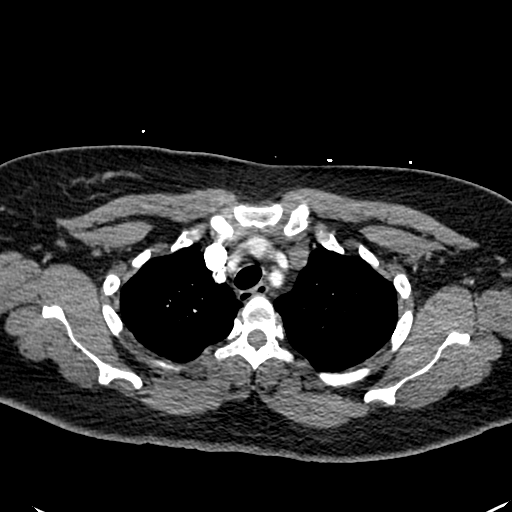
[im 188/235  lung]
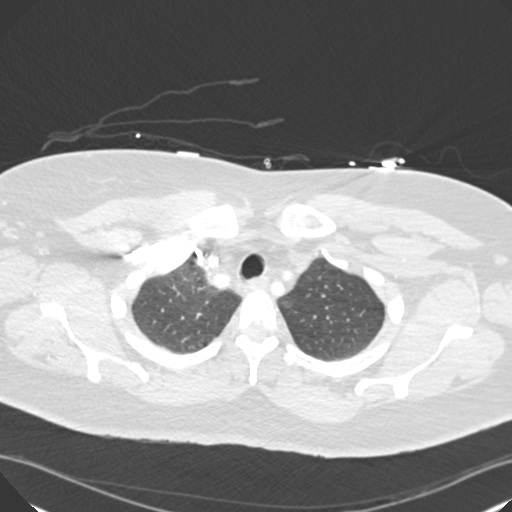
[im 199/235  mediastinal]
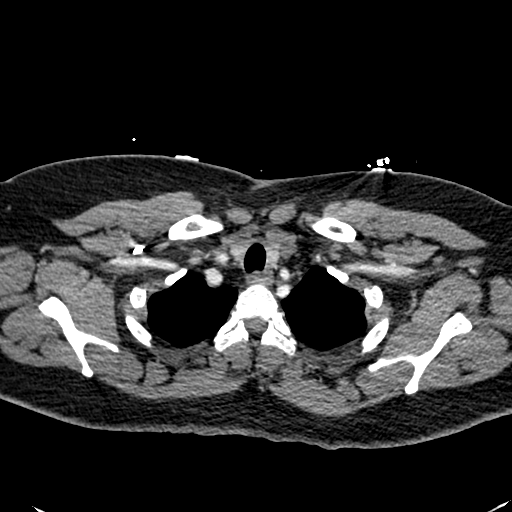
[im 211/235  lung]
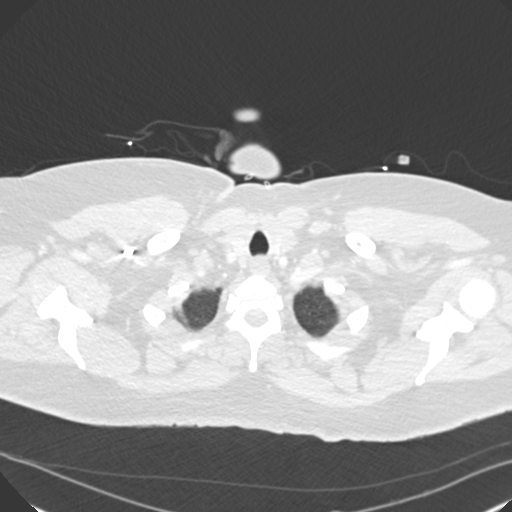
[im 223/235  mediastinal]
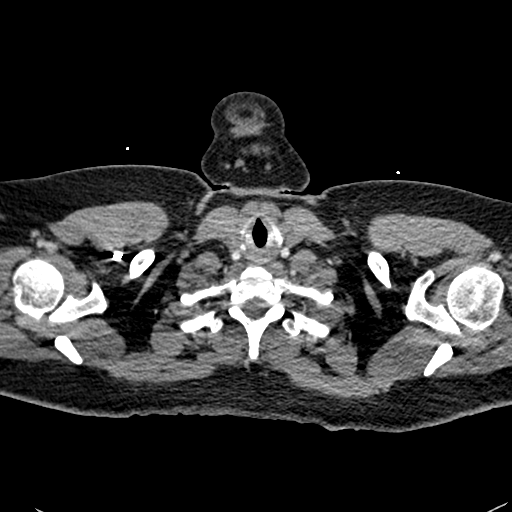

[Series 7: coronal mpr · coronal · 0.48mm/px · 1 of 89 slices shown]
[im 45/89  mediastinal]
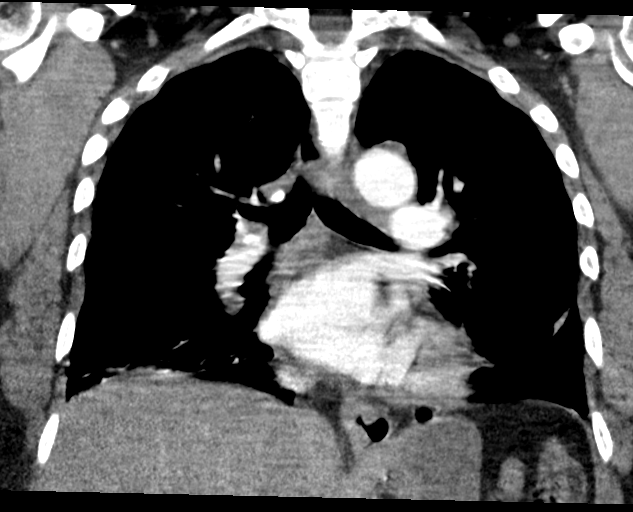

[19 of 36 positions shown; findings below may reference images not displayed]

FINDINGS: Cardiovascular: No filling defects in the pulmonary arteries to
suggest pulmonary emboli. Heart is normal size. Aorta is normal
caliber.

Mediastinum/Nodes: No mediastinal, hilar, or axillary adenopathy.
Trachea and esophagus are unremarkable. Thyroid unremarkable. Small
hiatal hernia.

Lungs/Pleura: Bibasilar atelectasis.  No effusions.

Upper Abdomen: Imaging into the upper abdomen shows no acute
findings.

Musculoskeletal: Chest wall soft tissues are unremarkable. No acute
bony abnormality.

Review of the MIP images confirms the above findings.
IMPRESSION: No evidence of pulmonary embolus.

Bibasilar atelectasis.

## 2020-07-28 ENCOUNTER — Other Ambulatory Visit: Payer: Self-pay

## 2020-07-28 ENCOUNTER — Encounter: Payer: Self-pay | Admitting: Emergency Medicine

## 2020-07-28 ENCOUNTER — Ambulatory Visit
Admission: EM | Admit: 2020-07-28 | Discharge: 2020-07-28 | Disposition: A | Discharge status: Home / Self Care | Payer: BC Managed Care – PPO | Attending: Family Medicine | Admitting: Family Medicine

## 2020-07-28 DIAGNOSIS — R079 Chest pain, unspecified: Secondary | ICD-10-CM | POA: Insufficient documentation

## 2020-07-28 LAB — TROPONIN I (HIGH SENSITIVITY): Troponin I (High Sensitivity): 3 ng/L (ref <18)

## 2020-07-28 MED ORDER — PREDNISONE 10 MG (21) PO TBPK: ORAL_TABLET | ORAL | 0 refills | Status: AC

## 2020-07-28 NOTE — ED Provider Notes (Signed)
[facility]    CSN: [id number] Arrival date & time: 07/28/20  1616  History   Chief Complaint Chief Complaint  Patient presents with  . Chest Pain   HPI  55 year old female presents with the above complaint.  Started yesterday. She reports left sided chest pain. Associated left sided neck pain. Also reports numbness of the left arm. Pain 4/10 in severity. No SOB. No diaphoresis. No known relieving factors. She notes that she is under a great deal of stress. No other complaints at this time.   Past Medical History:  Diagnosis Date  . Depression   . GERD (gastroesophageal reflux disease)   . Hives   . Migraines     Patient Active Problem List   Diagnosis Date Noted  . Sepsis (HCC) 02/08/2019    Past Surgical History:  Procedure Laterality Date  . CESAREAN SECTION    . KNEE ARTHROSCOPY      OB History   No obstetric history on file.      Home Medications    Prior to Admission medications   Medication Sig Start Date End Date Taking? Authorizing Provider  famotidine (PEPCID) 20 MG tablet Take 20 mg by mouth 2 (two) times daily.   Yes [provider]  ferrous sulfate 325 (65 FE) MG tablet Take by mouth.   Yes [provider]  omalizumab Geoffry Paradise) 150 MG/ML prefilled syringe INJECT 2 SYRINGES UNDER THE SKIN EVERY 4 WEEKS 09/01/19  Yes [provider]  pantoprazole (PROTONIX) 20 MG tablet Take 1 tablet by mouth daily. 02/05/20  Yes [provider]  predniSONE (STERAPRED UNI-PAK 21 TAB) 10 MG (21) TBPK tablet 6 tablets on day 1; decrease by 1 tablet daily until gone. 07/28/20  Yes Myka Lukins G, DO  sertraline (ZOLOFT) 50 MG tablet Take 100 mg by mouth daily. 01/22/19 07/28/20 Yes [provider]  SUMAtriptan (IMITREX) 100 MG tablet sumatriptan 100 mg tablet  TAKE 1 TABLET BY MOUTH ONCE AS NEEDED FOR UP TO 1 DOSE. MAY TAKE 2ND DOSE AFTER 2 HOURS IF NEEDED 09/08/18  Yes [provider]  topiramate (TOPAMAX) 100 MG  tablet Take 100 mg by mouth at bedtime.    Yes [provider]  traZODone (DESYREL) 50 MG tablet Take 50 mg by mouth at bedtime as needed. 06/25/20  Yes [provider]  hydrOXYzine (VISTARIL) 25 MG capsule Take 25 mg by mouth 3 (three) times daily as needed.    [provider]  albuterol (VENTOLIN HFA) 108 (90 Base) MCG/ACT inhaler Inhale 1-2 puffs into the lungs every 4 (four) hours as needed. 02/03/19 07/28/20  [provider]  fexofenadine (ALLEGRA) 60 MG tablet Take 60 mg by mouth 2 (two) times daily.   07/28/20  [provider]  mirtazapine (REMERON) 15 MG tablet Take 15 mg by mouth at bedtime.  07/28/20  [provider]  olmesartan (BENICAR) 40 MG tablet Take 40 mg by mouth daily. 12/24/18 07/28/20  [provider]  ranitidine (ZANTAC) 150 MG tablet Take 150 mg by mouth 2 (two) times daily.  07/28/20  [provider]  spironolactone (ALDACTONE) 25 MG tablet Take 0.5 tablets by mouth daily. 11/25/18 07/28/20  [provider]  venlafaxine XR (EFFEXOR-XR) 75 MG 24 hr capsule Take 1 capsule by mouth daily.  07/28/20  [provider]   Social History Social History   Tobacco Use  . Smoking status: Never Smoker  . Smokeless tobacco: Never Used  Substance Use Topics  .  Alcohol use: Yes    Comment: social  . Drug use: No     Allergies   Nsaids and Erythromycin   Review of Systems Review of Systems  Cardiovascular: Positive for chest pain.  Musculoskeletal: Positive for neck pain.  Neurological: Positive for numbness.   Physical Exam Triage Vital Signs ED Triage Vitals  Enc Vitals Group     BP 07/28/20 1633 (!) 144/86     Pulse Rate 07/28/20 1633 71     Resp 07/28/20 1633 18     Temp 07/28/20 1633 98.1 F (36.7 C)     Temp Source 07/28/20 1633 Oral     SpO2 07/28/20 1633 100 %     Weight 07/28/20 1629 250 lb 3.6 oz (113.5 kg)     Height 07/28/20 1629 5\' 5"  (1.651 m)     Head Circumference --      Peak  Flow --      Pain Score 07/28/20 1628 4     Pain Loc --      Pain Edu? --      Excl. in GC? --    Updated Vital Signs BP (!) 144/86 (BP Location: Left Arm)   Pulse 71   Temp 98.1 F (36.7 C) (Oral)   Resp 18   Ht 5\' 5"  (1.651 m)   Wt 113.5 kg   LMP  (LMP Unknown)   SpO2 100%   BMI 41.64 kg/m   Visual Acuity Right Eye Distance:   Left Eye Distance:   Bilateral Distance:    Right Eye Near:   Left Eye Near:    Bilateral Near:     Physical Exam Vitals and nursing note reviewed.  Constitutional:      General: She is not in acute distress.    Appearance: She is well-developed. She is not ill-appearing.  HENT:     Head: Normocephalic and atraumatic.  Eyes:     General:        Right eye: No discharge.        Left eye: No discharge.     Conjunctiva/sclera: Conjunctivae normal.  Cardiovascular:     Rate and Rhythm: Normal rate and regular rhythm.  Pulmonary:     Effort: Pulmonary effort is normal.     Breath sounds: Normal breath sounds. No wheezing, rhonchi or rales.  Neurological:     Mental Status: She is alert.  Psychiatric:        Mood and Affect: Mood normal.        Behavior: Behavior normal.    UC Treatments / Results  Labs (all labs ordered are listed, but only abnormal results are displayed) Labs Reviewed  TROPONIN I (HIGH SENSITIVITY)  TROPONIN I (HIGH SENSITIVITY)    EKG Interpretation: Normal sinus rhythm with rate of 69.  Probable LAE.  No ST or T wave changes.  Unremarkable EKG.  Radiology No results found.  Procedures Procedures (including critical care time)  Medications Ordered in UC Medications - No data to display  Initial Impression / Assessment and Plan / UC Course  I have reviewed the triage vital signs and the nursing notes.  Pertinent labs & imaging results that were available during my care of the patient were reviewed by me and considered in my medical decision making (see chart for details).    55 year old female presents  with atypical chest pain.  This appears to be mostly musculoskeletal as she is endorsing left-sided neck pain as well as left arm pain and numbness.  Possible component of cervical radiculopathy.  EKG nonischemic.  Troponin negative.  Prednisone as prescribed.  Supportive care.  Final Clinical Impressions(s) / UC Diagnoses   Final diagnoses:  Chest pain, unspecified type     Discharge Instructions     Rest.  Medication as prescribed.  Take care  Dr. Adriana Simas    ED Prescriptions    Medication Sig Dispense Auth. Provider   predniSONE (STERAPRED UNI-PAK 21 TAB) 10 MG (21) TBPK tablet 6 tablets on day 1; decrease by 1 tablet daily until gone. 21 tablet Everlene Other G, DO     PDMP not reviewed this encounter.   Tommie Sams, [place] 07/28/20 2221

## 2020-07-28 NOTE — ED Triage Notes (Signed)
Patient reports left side chest pain and arm pain that started yesterday. She states that pain has been continuous.

## 2020-07-28 NOTE — Discharge Instructions (Signed)
Rest  Medication as prescribed.  Take care  Dr. Zyann Mabry  

## 2022-01-04 ENCOUNTER — Ambulatory Visit
Admission: EM | Admit: 2022-01-04 | Discharge: 2022-01-04 | Disposition: A | Discharge status: Home / Self Care | Payer: BC Managed Care – PPO

## 2022-01-04 ENCOUNTER — Ambulatory Visit (INDEPENDENT_AMBULATORY_CARE_PROVIDER_SITE_OTHER): Payer: BC Managed Care – PPO

## 2022-01-04 DIAGNOSIS — R051 Acute cough: Secondary | ICD-10-CM

## 2022-01-04 DIAGNOSIS — H6591 Unspecified nonsuppurative otitis media, right ear: Secondary | ICD-10-CM

## 2022-01-04 DIAGNOSIS — H9201 Otalgia, right ear: Secondary | ICD-10-CM

## 2022-01-04 DIAGNOSIS — R0602 Shortness of breath: Secondary | ICD-10-CM | POA: Diagnosis not present

## 2022-01-04 DIAGNOSIS — U071 COVID-19: Secondary | ICD-10-CM

## 2022-01-04 MED ORDER — MECLIZINE HCL 25 MG PO TABS: 25.0000 mg | ORAL_TABLET | Freq: Three times a day (TID) | ORAL | 0 refills | Status: AC | PRN

## 2022-01-04 MED ORDER — IPRATROPIUM BROMIDE 0.06 % NA SOLN: 2.0000 | Freq: Four times a day (QID) | NASAL | 0 refills | Status: AC

## 2022-01-04 NOTE — ED Triage Notes (Signed)
Pt c/o covid positive and right ear pain x1week.   Pt was given paxlovid by her doctor and believes the ear pain and facial pain is from covid.  Pt is having facial pain and dizziness.

## 2022-01-04 NOTE — ED Provider Notes (Signed)
[facility]    CSN: [id number] Arrival date & time: 01/04/22  0912      History   Chief Complaint Chief Complaint  Patient presents with   Covid Positive   Ear Problem   Facial Pain         HPI Sarah Warner is a 56 y.o. female presenting with multiple complaints.  Patient reports dry cough, fatigue and "breathlessness" for the past 7 to 10 days.  History of vertigo.  Reports ear fullness x 9 days and right ear pain x 1 week. Also reporting facial pain and feeling dizzy.  Patient tested positive for COVID 19 on 12/25/2021. Had a telemedicine visit on 12/26/21 where she was prescribed albuterol, prednisone, Paxlovid, and benzonatate.  Patient has not tried any decongestants or nasal sprays.  Patient reports that her breathlessness is getting worse.  States that she has a history of lung problems but no documentation of asthma, COPD, restrictive lung disease in chart.  HPI  Past Medical History:  Diagnosis Date   Depression    GERD (gastroesophageal reflux disease)    Hives    Migraines     Patient Active Problem List   Diagnosis Date Noted   Sepsis (HCC) 02/08/2019    Past Surgical History:  Procedure Laterality Date   CESAREAN SECTION     KNEE ARTHROSCOPY      OB History   No obstetric history on file.      Home Medications    Prior to Admission medications   Medication Sig Start Date End Date Taking? Authorizing Provider  famotidine (PEPCID) 20 MG tablet Take 20 mg by mouth 2 (two) times daily.   Yes [provider]  ferrous sulfate 325 (65 FE) MG tablet Take by mouth.   Yes [provider]  hydroxychloroquine (PLAQUENIL) 200 MG tablet Take 400 mg by mouth daily. 11/20/21  Yes [provider]  hydrOXYzine (VISTARIL) 25 MG capsule Take 25 mg by mouth 3 (three) times daily as needed.   Yes [provider]  omalizumab Geoffry Paradise) 150 MG/ML prefilled syringe INJECT 2 SYRINGES UNDER THE SKIN EVERY 4 WEEKS 09/01/19  Yes  [provider]  pantoprazole (PROTONIX) 20 MG tablet Take 1 tablet by mouth daily. 02/05/20  Yes [provider]  SUMAtriptan (IMITREX) 100 MG tablet sumatriptan 100 mg tablet  TAKE 1 TABLET BY MOUTH ONCE AS NEEDED FOR UP TO 1 DOSE. MAY TAKE 2ND DOSE AFTER 2 HOURS IF NEEDED 09/08/18  Yes [provider]  topiramate (TOPAMAX) 100 MG tablet Take 100 mg by mouth at bedtime.    Yes [provider]  predniSONE (STERAPRED UNI-PAK 21 TAB) 10 MG (21) TBPK tablet 6 tablets on day 1; decrease by 1 tablet daily until gone. 07/28/20   Tommie Sams, DO  sertraline (ZOLOFT) 50 MG tablet Take 100 mg by mouth daily. 01/22/19 07/28/20  [provider]  SUMAtriptan (IMITREX) 100 MG tablet Take by mouth.    [provider]  traZODone (DESYREL) 50 MG tablet Take 50 mg by mouth at bedtime as needed. 06/25/20   [provider]  albuterol (VENTOLIN HFA) 108 (90 Base) MCG/ACT inhaler Inhale 1-2 puffs into the lungs every 4 (four) hours as needed. 02/03/19 07/28/20  [provider]  fexofenadine (ALLEGRA) 60 MG tablet Take 60 mg by mouth 2 (two) times daily.   07/28/20  [provider]  mirtazapine (REMERON) 15 MG tablet Take 15 mg by mouth at bedtime.  07/28/20  [provider]  olmesartan (BENICAR) 40 MG tablet Take 40 mg by mouth daily. 12/24/18 07/28/20  [provider]  ranitidine (ZANTAC) 150 MG tablet Take 150 mg by mouth 2 (two) times daily.  07/28/20  [provider]  spironolactone (ALDACTONE) 25 MG tablet Take 0.5 tablets by mouth daily. 11/25/18 07/28/20  [provider]  venlafaxine XR (EFFEXOR-XR) 75 MG 24 hr capsule Take 1 capsule by mouth daily.  07/28/20  [provider]    Family History History reviewed. No pertinent family history.  Social History Social History   Tobacco Use   Smoking status: Never   Smokeless tobacco: Never  Vaping Use   Vaping Use: Never used  Substance Use Topics    Alcohol use: Yes    Comment: social   Drug use: No     Allergies   Nsaids and Erythromycin   Review of Systems Review of Systems  Constitutional:  Positive for fatigue. Negative for chills, diaphoresis and fever.  HENT:  Positive for congestion, ear pain, rhinorrhea, sinus pressure and sinus pain. Negative for ear discharge, hearing loss and sore throat.   Respiratory:  Positive for cough and shortness of breath.   Cardiovascular:  Negative for chest pain.  Gastrointestinal:  Negative for abdominal pain, nausea and vomiting.  Musculoskeletal:  Negative for arthralgias and myalgias.  Skin:  Negative for rash.  Neurological:  Positive for dizziness. Negative for weakness and headaches.  Hematological:  Negative for adenopathy.     Physical Exam Triage Vital Signs ED Triage Vitals  Enc Vitals Group     BP      Pulse      Resp      Temp      Temp src      SpO2      Weight      Height      Head Circumference      Peak Flow      Pain Score      Pain Loc      Pain Edu?      Excl. in GC?    No data found.  Updated Vital Signs BP 131/71 (BP Location: Left Arm)   Pulse 74   Temp 98.5 F (36.9 C) (Oral)   Resp 18   Ht 5\' 5"  (1.651 m)   Wt 220 lb (99.8 kg)   LMP  (LMP Unknown)   SpO2 100%   BMI 36.61 kg/m      Physical Exam Vitals and nursing note reviewed.  Constitutional:      General: She is not in acute distress.    Appearance: Normal appearance. She is ill-appearing (mostly appears fatigued). She is not toxic-appearing.  HENT:     Head: Normocephalic and atraumatic.     Right Ear: Ear canal and external ear normal. A middle ear effusion is present.     Left Ear: Tympanic membrane, ear canal and external ear normal.     Nose: Mucosal edema and congestion present.     Mouth/Throat:     Mouth: Mucous membranes are moist.     Pharynx: Oropharynx is clear.  Eyes:     General: No scleral icterus.       Right eye: No discharge.        Left eye: No  discharge.     Conjunctiva/sclera: Conjunctivae normal.  Cardiovascular:     Rate and Rhythm: Normal rate and regular rhythm.     Heart sounds: Normal heart sounds.  Pulmonary:     Effort: Pulmonary effort is normal. No respiratory distress.     Breath sounds: Normal breath sounds. No wheezing, rhonchi or rales.  Musculoskeletal:     Cervical back: Neck supple.  Skin:    General: Skin is dry.  Neurological:     General: No focal deficit present.     Mental Status: She is alert. Mental status is at baseline.     Motor: No weakness.     Gait: Gait normal.  Psychiatric:        Mood and Affect: Mood normal.        Behavior: Behavior normal.        Thought Content: Thought content normal.      UC Treatments / Results  Labs (all labs ordered are listed, but only abnormal results are displayed) Labs Reviewed - No data to display  EKG   Radiology No results found.  Procedures Procedures (including critical care time)  Medications Ordered in UC Medications - No data to display  Initial Impression / Assessment and Plan / UC Course  I have reviewed the triage vital signs and the nursing notes.  Pertinent labs & imaging results that were available during my care of the patient were reviewed by me and considered in my medical decision making (see chart for details).   56 y/o female with COVID (dx 12/26/21) is presenting cough, fatigue and "breathlessness for the past 7 to 10 days.  Also presenting for 1 week history of right ear pain and facial pain. Cough, congestion and ear fullness started about 10 days ago. Has taken Paxlovid, prednisone, benzonatate and used albuterol.  Has not tried decongestants or nasal sprays.  VSS and patient overall well appearing, appears to be a little fatigued.  No respiratory distress.  On exam she does have clear effusion of right TM without erythema or bulging.  Significant nasal congestion and mucosal edema without drainage.  Chest clear to  auscultation heart regular rate and rhythm.  Chest x-ray obtained to assess for possible pneumonia given her shortness of breath complaint.  Advised her there is no sign of an ear infection and she just has fluid behind her eardrum which should resolve with decongestants and nasal sprays.  Advised also Tylenol for discomfort.  Chest x-ray shows no acute abnormality.  Discussed result with patient.  She has albuterol inhaler to use at home if needed.  Oxygen is stable to 100% and she is in no respiratory distress.  Encouraged her to rest but if the shortness of breath worsens or she has chest pain, fever, worsening cough she should go to the emergency department.  As for treatment for her dizziness, I have sent meclizine.  I advised her to try Mucinex D over-the-counter in addition to the Atrovent nasal spray to help with the effusion of her right ear which is likely causing her ear pain.  Encouraged Tylenol for pain as well.  Encouraged increasing rest and fluids.  Reviewed return and ER precautions, otherwise advised to follow-up with her PCP.   Final Clinical Impressions(s) / UC Diagnoses   Final diagnoses:  Right otitis media with effusion  Right ear pain  Acute cough  Shortness of breath  COVID-19     Discharge Instructions      -X-ray does not show any evidence of pneumonia or acute abnormality.  -You do not have an ear infection. You do have fluid behind your ear drum. You should take Mucinex D (ask the pharmacy staff  for this--it is behind the counter and you need to show ID). Also begin using nasal spray as prescribed. Additionally, I did send something for the dizziness. May try OTC Robitussin for cough and throat lozenges. Make sure to continue to rest and increase fluids. Use albuterol if you feel short of breath.  -Make follow up appt with your PCP.  -Go to ER if your breathing worsens, you have a fever, chest pain, or worsening fatigue.     ED Prescriptions   None     PDMP not reviewed this encounter.   Shirlee Latch, PA-C 01/04/22 1041

## 2022-01-04 NOTE — Discharge Instructions (Addendum)
-  X-ray does not show any evidence of pneumonia or acute abnormality.  -You do not have an ear infection. You do have fluid behind your ear drum. You should take Mucinex D (ask the pharmacy staff for this--it is behind the counter and you need to show ID). Also begin using nasal spray as prescribed. Additionally, I did send something for the dizziness. May try OTC Robitussin for cough and throat lozenges. Make sure to continue to rest and increase fluids. Use albuterol if you feel short of breath.  -Make follow up appt with your PCP.  -Go to ER if your breathing worsens, you have a fever, chest pain, or worsening fatigue.

## 2022-11-17 ENCOUNTER — Ambulatory Visit
Admission: EM | Admit: 2022-11-17 | Discharge: 2022-11-17 | Disposition: A | Discharge status: Home / Self Care | Payer: BC Managed Care – PPO

## 2022-11-17 DIAGNOSIS — L918 Other hypertrophic disorders of the skin: Secondary | ICD-10-CM | POA: Diagnosis not present

## 2022-11-17 NOTE — ED Provider Notes (Signed)
[facility]    CSN: [id number] Arrival date & time: 11/17/22  1139      History   Chief Complaint Chief Complaint  Patient presents with   Wound Check    HPI Sarah Warner is a 57 y.o. female presenting for discomfort of a skin tag of the left inner thigh for the past 1 week.  She states it has been present for a while but she just recently did a lot of walking in [place] and it became irritated.  She has been keeping covered with a bandage.  There is no swelling or redness.  No drainage around the skin tag.  She thinks it could be infected or just irritated.  She is interested in having it removed.  HPI  Past Medical History:  Diagnosis Date   Depression    GERD (gastroesophageal reflux disease)    Hives    Migraines     Patient Active Problem List   Diagnosis Date Noted   Sepsis (HCC) 02/08/2019    Past Surgical History:  Procedure Laterality Date   CESAREAN SECTION     KNEE ARTHROSCOPY      OB History   No obstetric history on file.      Home Medications    Prior to Admission medications   Medication Sig Start Date End Date Taking? Authorizing Provider  famotidine (PEPCID) 20 MG tablet Take 20 mg by mouth 2 (two) times daily.    [provider]  ferrous sulfate 325 (65 FE) MG tablet Take by mouth.    [provider]  hydroxychloroquine (PLAQUENIL) 200 MG tablet Take 400 mg by mouth daily. 11/20/21   [provider]  hydrOXYzine (VISTARIL) 25 MG capsule Take 25 mg by mouth 3 (three) times daily as needed.    [provider]  ipratropium (ATROVENT) 0.06 % nasal spray Place 2 sprays into both nostrils 4 (four) times daily. 01/04/22   Eusebio Friendly B, PA-C  omalizumab Geoffry Paradise) 150 MG/ML prefilled syringe INJECT 2 SYRINGES UNDER THE SKIN EVERY 4 WEEKS 09/01/19   [provider]  pantoprazole (PROTONIX) 20 MG tablet Take 1 tablet by mouth daily. 02/05/20   [provider]  predniSONE (STERAPRED UNI-PAK 21  TAB) 10 MG (21) TBPK tablet 6 tablets on day 1; decrease by 1 tablet daily until gone. 07/28/20   Tommie Sams, DO  sertraline (ZOLOFT) 50 MG tablet Take 100 mg by mouth daily. 01/22/19 07/28/20  [provider]  SUMAtriptan (IMITREX) 100 MG tablet sumatriptan 100 mg tablet  TAKE 1 TABLET BY MOUTH ONCE AS NEEDED FOR UP TO 1 DOSE. MAY TAKE 2ND DOSE AFTER 2 HOURS IF NEEDED 09/08/18   [provider]  SUMAtriptan (IMITREX) 100 MG tablet Take by mouth.    [provider]  topiramate (TOPAMAX) 100 MG tablet Take 100 mg by mouth at bedtime.     [provider]  traZODone (DESYREL) 50 MG tablet Take 50 mg by mouth at bedtime as needed. 06/25/20   [provider]  albuterol (VENTOLIN HFA) 108 (90 Base) MCG/ACT inhaler Inhale 1-2 puffs into the lungs every 4 (four) hours as needed. 02/03/19 07/28/20  [provider]  fexofenadine (ALLEGRA) 60 MG tablet Take 60 mg by mouth 2 (two) times daily.   07/28/20  [provider]  mirtazapine (REMERON) 15 MG tablet Take 15 mg by mouth at bedtime.  07/28/20  [provider]  olmesartan (BENICAR) 40 MG tablet Take 40 mg by mouth  daily. 12/24/18 07/28/20  [provider]  ranitidine (ZANTAC) 150 MG tablet Take 150 mg by mouth 2 (two) times daily.  07/28/20  [provider]  spironolactone (ALDACTONE) 25 MG tablet Take 0.5 tablets by mouth daily. 11/25/18 07/28/20  [provider]  venlafaxine XR (EFFEXOR-XR) 75 MG 24 hr capsule Take 1 capsule by mouth daily.  07/28/20  [provider]    Family History History reviewed. No pertinent family history.  Social History Social History   Tobacco Use   Smoking status: Never   Smokeless tobacco: Never  Vaping Use   Vaping Use: Never used  Substance Use Topics   Alcohol use: Yes    Comment: social   Drug use: No     Allergies   Nsaids and Erythromycin   Review of Systems Review of Systems  Constitutional:  Negative for  fatigue and fever.  Skin:  Positive for color change. Negative for rash and wound.  Neurological:  Negative for weakness.     Physical Exam Triage Vital Signs ED Triage Vitals  Enc Vitals Group     BP 11/17/22 1233 138/85     Pulse Rate 11/17/22 1233 64     Resp --      Temp 11/17/22 1233 98.5 F (36.9 C)     Temp Source 11/17/22 1233 Oral     SpO2 11/17/22 1233 96 %     Weight 11/17/22 1231 235 lb (106.6 kg)     Height 11/17/22 1231 5\' 5"  (1.651 m)     Head Circumference --      Peak Flow --      Pain Score 11/17/22 1230 8     Pain Loc --      Pain Edu? --      Excl. in GC? --    No data found.  Updated Vital Signs BP 138/85 (BP Location: Left Arm)   Pulse 64   Temp 98.5 F (36.9 C) (Oral)   Ht 5\' 5"  (1.651 m)   Wt 235 lb (106.6 kg)   LMP  (LMP Unknown)   SpO2 96%   BMI 39.11 kg/m   Physical Exam Vitals and nursing note reviewed.  Constitutional:      General: She is not in acute distress.    Appearance: Normal appearance. She is not ill-appearing or toxic-appearing.  HENT:     Head: Normocephalic and atraumatic.  Eyes:     General: No scleral icterus.       Right eye: No discharge.        Left eye: No discharge.     Conjunctiva/sclera: Conjunctivae normal.  Cardiovascular:     Rate and Rhythm: Normal rate.  Pulmonary:     Effort: Pulmonary effort is normal. No respiratory distress.  Musculoskeletal:     Cervical back: Neck supple.  Skin:    General: Skin is dry.     Comments: 1 small skin tag left inner thigh. Tenderness. No bleeding or erythema.  Neurological:     General: No focal deficit present.     Mental Status: She is alert. Mental status is at baseline.     Motor: No weakness.     Gait: Gait normal.  Psychiatric:        Mood and Affect: Mood normal.      UC Treatments / Results  Labs (all labs ordered are listed, but only abnormal results are displayed) Labs Reviewed - No data to display  EKG   Radiology No results  found.  Procedures Procedures (including critical care time)  SKIN TAG REMOVAL: Small irritated skin tag of the left inner thigh.  Patient gives consent for removal.  Area cleaned with alcohol swabs.  No anesthesia used.  Used forceps and 11 blade scalpel to remove skin tag at stalk.  Completely removed.  Moderate amount of bleeding.  Bleeding controlled with use of silver nitrate.  Patient tolerated this well.  Cover the area with adhesive bandage.  Medications Ordered in UC Medications - No data to display  Initial Impression / Assessment and Plan / UC Course  I have reviewed the triage vital signs and the nursing notes.  Pertinent labs & imaging results that were available during my care of the patient were reviewed by me and considered in my medical decision making (see chart for details).   57 year old female presents for irritated skin tag for the past several days.  Reports that her thighs were rubbing together and cause irritation of the skin tag.  She would like to have it removed.  There is no evidence of infection.  After patient consent obtained for skin tag removal I was able to remove it easily.  Bleeding controlled with use of silver nitrate.  Discussed wound care guidelines.  Advised to return if any signs of infection.  Supportive care.  Follow-up as needed.   Final Clinical Impressions(s) / UC Diagnoses   Final diagnoses:  Skin tag     Discharge Instructions      -Skin tag removed.  I used silver nitrate to cauterize the area since it continued to bleed.  Change the bandage daily.  Clean the area with soap and water.  Do not scrub.  If you experience increased redness, swelling or worsening pain, please return for reevaluation to see if the area is infected but it does not appear to be infected at this time.    ED Prescriptions   None    PDMP not reviewed this encounter.   Shirlee Latch, PA-C 11/17/22 1545

## 2022-11-17 NOTE — Discharge Instructions (Addendum)
-  Skin tag removed.  I used silver nitrate to cauterize the area since it continued to bleed.  Change the bandage daily.  Clean the area with soap and water.  Do not scrub.  If you experience increased redness, swelling or worsening pain, please return for reevaluation to see if the area is infected but it does not appear to be infected at this time.

## 2022-11-17 NOTE — ED Triage Notes (Signed)
Pt c/o skin tag on inside of left thigh x1week  Pt states that it was rubbing on her thighs and got irritated and began to bleed.   Pt believes that it is infected.
